# Patient Record
Sex: Female | Born: 1945 | Race: White | Hispanic: No | Marital: Married | Smoking: Never smoker
Health system: Southern US, Community
[De-identification: ages and names within clinical notes are randomized; demographics above are authoritative.]

## PROBLEM LIST (undated history)

## (undated) DIAGNOSIS — K7460 Unspecified cirrhosis of liver: Secondary | ICD-10-CM

## (undated) DIAGNOSIS — K219 Gastro-esophageal reflux disease without esophagitis: Secondary | ICD-10-CM

## (undated) DIAGNOSIS — R011 Cardiac murmur, unspecified: Secondary | ICD-10-CM

## (undated) DIAGNOSIS — F329 Major depressive disorder, single episode, unspecified: Secondary | ICD-10-CM

## (undated) DIAGNOSIS — H269 Unspecified cataract: Secondary | ICD-10-CM

## (undated) DIAGNOSIS — F419 Anxiety disorder, unspecified: Secondary | ICD-10-CM

## (undated) DIAGNOSIS — Z973 Presence of spectacles and contact lenses: Secondary | ICD-10-CM

## (undated) DIAGNOSIS — M5410 Radiculopathy, site unspecified: Secondary | ICD-10-CM

## (undated) DIAGNOSIS — J302 Other seasonal allergic rhinitis: Secondary | ICD-10-CM

## (undated) DIAGNOSIS — M797 Fibromyalgia: Secondary | ICD-10-CM

## (undated) DIAGNOSIS — F32A Depression, unspecified: Secondary | ICD-10-CM

## (undated) DIAGNOSIS — K7581 Nonalcoholic steatohepatitis (NASH): Secondary | ICD-10-CM

## (undated) DIAGNOSIS — E039 Hypothyroidism, unspecified: Secondary | ICD-10-CM

## (undated) DIAGNOSIS — I341 Nonrheumatic mitral (valve) prolapse: Secondary | ICD-10-CM

## (undated) HISTORY — PX: TONSILLECTOMY: SUR1361

## (undated) HISTORY — PX: CARPAL TUNNEL RELEASE: SHX101

## (undated) HISTORY — PX: COLONOSCOPY: SHX174

## (undated) HISTORY — PX: CHOLECYSTECTOMY: SHX55

---

## 2015-07-02 ENCOUNTER — Other Ambulatory Visit: Payer: Self-pay | Admitting: Orthopedic Surgery

## 2015-07-05 NOTE — H&P (Signed)
     PREOPERATIVE H&P  Chief Complaint: Left leg pain   HPI: Emily Hess is a 70 y.o. female who presents with ongoing pain in the left leg  MRI reveals spinal stenosis from L3-L5  Patient has failed multiple forms of conservative care and continues to have pain (see office notes for additional details regarding the patient's full course of treatment)  No past medical history on file. No past surgical history on file. Social History   Social History  . Marital Status: N/A    Spouse Name: N/A  . Number of Children: N/A  . Years of Education: N/A   Social History Main Topics  . Smoking status: Not on file  . Smokeless tobacco: Not on file  . Alcohol Use: Not on file  . Drug Use: Not on file  . Sexual Activity: Not on file   Other Topics Concern  . Not on file   Social History Narrative  . No narrative on file   No family history on file. Allergies not on file Prior to Admission medications   Not on File     All other systems have been reviewed and were otherwise negative with the exception of those mentioned in the HPI and as above.  Physical Exam: There were no vitals filed for this visit.  General: Alert, no acute distress Cardiovascular: No pedal edema Respiratory: No cyanosis, no use of accessory musculature Skin: No lesions in the area of chief complaint Neurologic: Sensation intact distally Psychiatric: Patient is competent for consent with normal mood and affect Lymphatic: No axillary or cervical lymphadenopathy   Assessment/Plan: Left leg pain Plan for Procedure(s): LUMBAR 3-4, LUMBAR 4-5 DECOMPRESSION   Emilee HeroUMONSKI,Faduma Cho LEONARD, MD 07/05/2015 8:18 AM

## 2015-07-06 ENCOUNTER — Encounter (HOSPITAL_COMMUNITY): Payer: Self-pay | Admitting: *Deleted

## 2015-07-06 DIAGNOSIS — M4806 Spinal stenosis, lumbar region: Secondary | ICD-10-CM | POA: Diagnosis present

## 2015-07-06 DIAGNOSIS — M79605 Pain in left leg: Secondary | ICD-10-CM | POA: Diagnosis present

## 2015-07-06 DIAGNOSIS — K7460 Unspecified cirrhosis of liver: Secondary | ICD-10-CM | POA: Diagnosis present

## 2015-07-06 DIAGNOSIS — K7581 Nonalcoholic steatohepatitis (NASH): Secondary | ICD-10-CM | POA: Diagnosis present

## 2015-07-06 DIAGNOSIS — J302 Other seasonal allergic rhinitis: Secondary | ICD-10-CM | POA: Diagnosis present

## 2015-07-06 DIAGNOSIS — E039 Hypothyroidism, unspecified: Secondary | ICD-10-CM | POA: Diagnosis present

## 2015-07-06 DIAGNOSIS — I341 Nonrheumatic mitral (valve) prolapse: Secondary | ICD-10-CM | POA: Diagnosis present

## 2015-07-06 DIAGNOSIS — F329 Major depressive disorder, single episode, unspecified: Secondary | ICD-10-CM | POA: Diagnosis present

## 2015-07-06 DIAGNOSIS — F419 Anxiety disorder, unspecified: Secondary | ICD-10-CM | POA: Diagnosis present

## 2015-07-06 DIAGNOSIS — G8918 Other acute postprocedural pain: Principal | ICD-10-CM | POA: Diagnosis present

## 2015-07-06 DIAGNOSIS — K219 Gastro-esophageal reflux disease without esophagitis: Secondary | ICD-10-CM | POA: Diagnosis present

## 2015-07-06 DIAGNOSIS — Z882 Allergy status to sulfonamides status: Secondary | ICD-10-CM | POA: Diagnosis not present

## 2015-07-06 DIAGNOSIS — M5386 Other specified dorsopathies, lumbar region: Secondary | ICD-10-CM | POA: Diagnosis present

## 2015-07-06 MED ORDER — CEFAZOLIN SODIUM-DEXTROSE 2-3 GM-% IV SOLR
2.0000 g | INTRAVENOUS | Status: AC
  Administered 2015-07-07: 2 g via INTRAVENOUS
  Filled 2015-07-06: qty 50

## 2015-07-07 ENCOUNTER — Ambulatory Visit (HOSPITAL_COMMUNITY): Payer: Medicare Other

## 2015-07-07 ENCOUNTER — Encounter (HOSPITAL_COMMUNITY): Payer: Self-pay | Admitting: Certified Registered Nurse Anesthetist

## 2015-07-07 ENCOUNTER — Ambulatory Visit (HOSPITAL_COMMUNITY): Payer: Medicare Other | Admitting: Certified Registered Nurse Anesthetist

## 2015-07-07 ENCOUNTER — Inpatient Hospital Stay (HOSPITAL_COMMUNITY)
Admission: RE | Admit: 2015-07-07 | Discharge: 2015-07-09 | DRG: 941 | Disposition: A | Discharge status: Home Health | Payer: Medicare Other | Source: Ambulatory Visit | Attending: Orthopedic Surgery | Admitting: Orthopedic Surgery

## 2015-07-07 ENCOUNTER — Encounter (HOSPITAL_COMMUNITY)
Admission: RE | Disposition: A | Discharge status: Home Health | Payer: Self-pay | Source: Ambulatory Visit | Attending: Orthopedic Surgery

## 2015-07-07 DIAGNOSIS — K7581 Nonalcoholic steatohepatitis (NASH): Secondary | ICD-10-CM | POA: Diagnosis present

## 2015-07-07 DIAGNOSIS — Z981 Arthrodesis status: Secondary | ICD-10-CM

## 2015-07-07 DIAGNOSIS — M5386 Other specified dorsopathies, lumbar region: Secondary | ICD-10-CM | POA: Diagnosis present

## 2015-07-07 DIAGNOSIS — F329 Major depressive disorder, single episode, unspecified: Secondary | ICD-10-CM | POA: Diagnosis present

## 2015-07-07 DIAGNOSIS — G8918 Other acute postprocedural pain: Secondary | ICD-10-CM | POA: Diagnosis present

## 2015-07-07 DIAGNOSIS — M4806 Spinal stenosis, lumbar region: Secondary | ICD-10-CM | POA: Diagnosis present

## 2015-07-07 DIAGNOSIS — K219 Gastro-esophageal reflux disease without esophagitis: Secondary | ICD-10-CM | POA: Diagnosis present

## 2015-07-07 DIAGNOSIS — J302 Other seasonal allergic rhinitis: Secondary | ICD-10-CM | POA: Diagnosis present

## 2015-07-07 DIAGNOSIS — Z01818 Encounter for other preprocedural examination: Secondary | ICD-10-CM

## 2015-07-07 DIAGNOSIS — Z419 Encounter for procedure for purposes other than remedying health state, unspecified: Secondary | ICD-10-CM

## 2015-07-07 DIAGNOSIS — E039 Hypothyroidism, unspecified: Secondary | ICD-10-CM | POA: Diagnosis present

## 2015-07-07 DIAGNOSIS — K7460 Unspecified cirrhosis of liver: Secondary | ICD-10-CM | POA: Diagnosis present

## 2015-07-07 DIAGNOSIS — M79605 Pain in left leg: Secondary | ICD-10-CM | POA: Diagnosis present

## 2015-07-07 DIAGNOSIS — Z882 Allergy status to sulfonamides status: Secondary | ICD-10-CM | POA: Diagnosis not present

## 2015-07-07 DIAGNOSIS — I341 Nonrheumatic mitral (valve) prolapse: Secondary | ICD-10-CM | POA: Diagnosis present

## 2015-07-07 DIAGNOSIS — Z9889 Other specified postprocedural states: Secondary | ICD-10-CM

## 2015-07-07 DIAGNOSIS — F419 Anxiety disorder, unspecified: Secondary | ICD-10-CM | POA: Diagnosis present

## 2015-07-07 HISTORY — DX: Hypothyroidism, unspecified: E03.9

## 2015-07-07 HISTORY — DX: Gastro-esophageal reflux disease without esophagitis: K21.9

## 2015-07-07 HISTORY — DX: Nonalcoholic steatohepatitis (NASH): K75.81

## 2015-07-07 HISTORY — DX: Unspecified cataract: H26.9

## 2015-07-07 HISTORY — DX: Other seasonal allergic rhinitis: J30.2

## 2015-07-07 HISTORY — DX: Nonrheumatic mitral (valve) prolapse: I34.1

## 2015-07-07 HISTORY — DX: Anxiety disorder, unspecified: F41.9

## 2015-07-07 HISTORY — DX: Cardiac murmur, unspecified: R01.1

## 2015-07-07 HISTORY — PX: LUMBAR LAMINECTOMY/DECOMPRESSION MICRODISCECTOMY: SHX5026

## 2015-07-07 HISTORY — DX: Fibromyalgia: M79.7

## 2015-07-07 HISTORY — DX: Major depressive disorder, single episode, unspecified: F32.9

## 2015-07-07 HISTORY — DX: Depression, unspecified: F32.A

## 2015-07-07 HISTORY — DX: Unspecified cirrhosis of liver: K74.60

## 2015-07-07 LAB — COMPREHENSIVE METABOLIC PANEL
ALT: 24 U/L (ref 14–54)
ANION GAP: 11 (ref 5–15)
AST: 22 U/L (ref 15–41)
Albumin: 4.4 g/dL (ref 3.5–5.0)
Alkaline Phosphatase: 50 U/L (ref 38–126)
BUN: 15 mg/dL (ref 6–20)
CHLORIDE: 102 mmol/L (ref 101–111)
CO2: 24 mmol/L (ref 22–32)
Calcium: 9.4 mg/dL (ref 8.9–10.3)
Creatinine, Ser: 0.83 mg/dL (ref 0.44–1.00)
Glucose, Bld: 106 mg/dL — ABNORMAL HIGH (ref 65–99)
POTASSIUM: 4.4 mmol/L (ref 3.5–5.1)
Sodium: 137 mmol/L (ref 135–145)
Total Bilirubin: 0.8 mg/dL (ref 0.3–1.2)
Total Protein: 7.1 g/dL (ref 6.5–8.1)

## 2015-07-07 LAB — URINALYSIS, ROUTINE W REFLEX MICROSCOPIC
BILIRUBIN URINE: NEGATIVE
GLUCOSE, UA: NEGATIVE mg/dL
Hgb urine dipstick: NEGATIVE
KETONES UR: NEGATIVE mg/dL
NITRITE: NEGATIVE
PROTEIN: NEGATIVE mg/dL
Specific Gravity, Urine: 1.017 (ref 1.005–1.030)
pH: 6 (ref 5.0–8.0)

## 2015-07-07 LAB — CBC WITH DIFFERENTIAL/PLATELET
BASOS ABS: 0 10*3/uL (ref 0.0–0.1)
Basophils Relative: 1 %
EOS PCT: 3 %
Eosinophils Absolute: 0.2 10*3/uL (ref 0.0–0.7)
HCT: 39.3 % (ref 36.0–46.0)
Hemoglobin: 13.7 g/dL (ref 12.0–15.0)
LYMPHS ABS: 1.8 10*3/uL (ref 0.7–4.0)
LYMPHS PCT: 32 %
MCH: 32.8 pg (ref 26.0–34.0)
MCHC: 34.9 g/dL (ref 30.0–36.0)
MCV: 94 fL (ref 78.0–100.0)
MONO ABS: 0.7 10*3/uL (ref 0.1–1.0)
MONOS PCT: 13 %
Neutro Abs: 2.9 10*3/uL (ref 1.7–7.7)
Neutrophils Relative %: 51 %
PLATELETS: 197 10*3/uL (ref 150–400)
RBC: 4.18 MIL/uL (ref 3.87–5.11)
RDW: 13 % (ref 11.5–15.5)
WBC: 5.6 10*3/uL (ref 4.0–10.5)

## 2015-07-07 LAB — PROTIME-INR
INR: 1.06 (ref 0.00–1.49)
PROTHROMBIN TIME: 14 s (ref 11.6–15.2)

## 2015-07-07 LAB — GLUCOSE, CAPILLARY: GLUCOSE-CAPILLARY: 129 mg/dL — AB (ref 65–99)

## 2015-07-07 LAB — URINE MICROSCOPIC-ADD ON

## 2015-07-07 LAB — APTT: APTT: 30 s (ref 24–37)

## 2015-07-07 SURGERY — LUMBAR LAMINECTOMY/DECOMPRESSION MICRODISCECTOMY
Anesthesia: General

## 2015-07-07 MED ORDER — ONDANSETRON HCL 4 MG/2ML IJ SOLN
INTRAMUSCULAR | Status: AC
  Filled 2015-07-07: qty 2

## 2015-07-07 MED ORDER — HYDROMORPHONE HCL 1 MG/ML IJ SOLN
INTRAMUSCULAR | Status: AC
  Administered 2015-07-07: 0.5 mg via INTRAVENOUS
  Filled 2015-07-07: qty 1

## 2015-07-07 MED ORDER — METHYLENE BLUE 0.5 % INJ SOLN
INTRAVENOUS | Status: DC | PRN
  Administered 2015-07-07: 1 mL via SUBMUCOSAL

## 2015-07-07 MED ORDER — DEXAMETHASONE SODIUM PHOSPHATE 4 MG/ML IJ SOLN
INTRAMUSCULAR | Status: DC | PRN
  Administered 2015-07-07: 8 mg via INTRAVENOUS

## 2015-07-07 MED ORDER — SODIUM CHLORIDE 0.9% FLUSH: 3.0000 mL | INTRAVENOUS | Status: DC | PRN

## 2015-07-07 MED ORDER — DEXAMETHASONE SODIUM PHOSPHATE 4 MG/ML IJ SOLN
INTRAMUSCULAR | Status: AC
  Filled 2015-07-07: qty 2

## 2015-07-07 MED ORDER — THROMBIN 20000 UNITS EX SOLR
CUTANEOUS | Status: AC
  Filled 2015-07-07: qty 20000

## 2015-07-07 MED ORDER — DIAZEPAM 5 MG PO TABS
ORAL_TABLET | ORAL | Status: AC
Start: 2015-07-07 — End: 2015-07-08
  Filled 2015-07-07: qty 1

## 2015-07-07 MED ORDER — LIDOCAINE HCL (CARDIAC) 20 MG/ML IV SOLN
INTRAVENOUS | Status: AC
  Filled 2015-07-07: qty 5

## 2015-07-07 MED ORDER — MIDAZOLAM HCL 2 MG/2ML IJ SOLN
INTRAMUSCULAR | Status: DC | PRN
  Administered 2015-07-07: 2 mg via INTRAVENOUS

## 2015-07-07 MED ORDER — GLYCOPYRROLATE 0.2 MG/ML IJ SOLN
INTRAMUSCULAR | Status: AC
  Filled 2015-07-07: qty 1

## 2015-07-07 MED ORDER — OXYCODONE-ACETAMINOPHEN 5-325 MG PO TABS
1.0000 | ORAL_TABLET | Freq: Once | ORAL | Status: AC
  Administered 2015-07-07: 2 via ORAL

## 2015-07-07 MED ORDER — BUPIVACAINE-EPINEPHRINE (PF) 0.25% -1:200000 IJ SOLN
INTRAMUSCULAR | Status: AC
  Filled 2015-07-07: qty 30

## 2015-07-07 MED ORDER — SUGAMMADEX SODIUM 200 MG/2ML IV SOLN
INTRAVENOUS | Status: AC
  Filled 2015-07-07: qty 2

## 2015-07-07 MED ORDER — DIAZEPAM 5 MG PO TABS
5.0000 mg | ORAL_TABLET | Freq: Four times a day (QID) | ORAL | Status: DC | PRN
  Administered 2015-07-07: 5 mg via ORAL
  Administered 2015-07-08 (×4): 10 mg via ORAL
  Administered 2015-07-09: 5 mg via ORAL
  Administered 2015-07-09: 10 mg via ORAL
  Filled 2015-07-07 (×4): qty 2
  Filled 2015-07-07: qty 1
  Filled 2015-07-07: qty 2
  Filled 2015-07-07 (×2): qty 1

## 2015-07-07 MED ORDER — DIAZEPAM 5 MG PO TABS
5.0000 mg | ORAL_TABLET | Freq: Once | ORAL | Status: AC
  Administered 2015-07-07: 5 mg via ORAL

## 2015-07-07 MED ORDER — PROMETHAZINE HCL 25 MG/ML IJ SOLN: 6.2500 mg | Freq: Four times a day (QID) | INTRAMUSCULAR | Status: DC | PRN

## 2015-07-07 MED ORDER — PHENYLEPHRINE HCL 10 MG/ML IJ SOLN
10.0000 mg | INTRAVENOUS | Status: DC | PRN
  Administered 2015-07-07: 50 ug/min via INTRAVENOUS

## 2015-07-07 MED ORDER — ARTIFICIAL TEARS OP OINT
TOPICAL_OINTMENT | OPHTHALMIC | Status: DC | PRN
  Administered 2015-07-07: 1 via OPHTHALMIC

## 2015-07-07 MED ORDER — FENTANYL CITRATE (PF) 250 MCG/5ML IJ SOLN
INTRAMUSCULAR | Status: DC | PRN
  Administered 2015-07-07: 100 ug via INTRAVENOUS
  Administered 2015-07-07: 50 ug via INTRAVENOUS
  Administered 2015-07-07: 100 ug via INTRAVENOUS

## 2015-07-07 MED ORDER — LORAZEPAM 2 MG/ML IJ SOLN
INTRAMUSCULAR | Status: AC
  Administered 2015-07-07: 0.5 mg via INTRAVENOUS
  Filled 2015-07-07: qty 1

## 2015-07-07 MED ORDER — LORAZEPAM 2 MG/ML IJ SOLN
0.5000 mg | Freq: Once | INTRAMUSCULAR | Status: AC
  Administered 2015-07-07: 0.5 mg via INTRAVENOUS

## 2015-07-07 MED ORDER — PROPOFOL 10 MG/ML IV BOLUS
INTRAVENOUS | Status: DC | PRN
  Administered 2015-07-07: 150 mg via INTRAVENOUS

## 2015-07-07 MED ORDER — MIDAZOLAM HCL 2 MG/2ML IJ SOLN
INTRAMUSCULAR | Status: AC
  Filled 2015-07-07: qty 2

## 2015-07-07 MED ORDER — FENTANYL CITRATE (PF) 250 MCG/5ML IJ SOLN
INTRAMUSCULAR | Status: AC
  Filled 2015-07-07: qty 5

## 2015-07-07 MED ORDER — LACTATED RINGERS IV SOLN
INTRAVENOUS | Status: DC
  Administered 2015-07-07 (×3): via INTRAVENOUS

## 2015-07-07 MED ORDER — LIDOCAINE HCL (CARDIAC) 20 MG/ML IV SOLN
INTRAVENOUS | Status: DC | PRN
  Administered 2015-07-07: 80 mg via INTRATRACHEAL

## 2015-07-07 MED ORDER — BUPIVACAINE-EPINEPHRINE 0.25% -1:200000 IJ SOLN
INTRAMUSCULAR | Status: DC | PRN
  Administered 2015-07-07: 30 mL

## 2015-07-07 MED ORDER — NEOSTIGMINE METHYLSULFATE 10 MG/10ML IV SOLN
INTRAVENOUS | Status: AC
  Filled 2015-07-07: qty 1

## 2015-07-07 MED ORDER — GLYCOPYRROLATE 0.2 MG/ML IJ SOLN
INTRAMUSCULAR | Status: AC
  Filled 2015-07-07: qty 3

## 2015-07-07 MED ORDER — METHYLPREDNISOLONE ACETATE 40 MG/ML IJ SUSP
INTRAMUSCULAR | Status: AC
  Filled 2015-07-07: qty 1

## 2015-07-07 MED ORDER — PROPOFOL 10 MG/ML IV BOLUS
INTRAVENOUS | Status: AC
  Filled 2015-07-07: qty 20

## 2015-07-07 MED ORDER — POVIDONE-IODINE 7.5 % EX SOLN
Freq: Once | CUTANEOUS | Status: DC
  Filled 2015-07-07: qty 118

## 2015-07-07 MED ORDER — GLYCOPYRROLATE 0.2 MG/ML IJ SOLN
INTRAMUSCULAR | Status: DC | PRN
  Administered 2015-07-07: 0.6 mg via INTRAVENOUS
  Administered 2015-07-07: 0.2 mg via INTRAVENOUS

## 2015-07-07 MED ORDER — THROMBIN 20000 UNITS EX SOLR
CUTANEOUS | Status: DC | PRN
  Administered 2015-07-07: 14:00:00 via TOPICAL

## 2015-07-07 MED ORDER — OXYCODONE-ACETAMINOPHEN 5-325 MG PO TABS
ORAL_TABLET | ORAL | Status: AC
Start: 2015-07-07 — End: 2015-07-08
  Filled 2015-07-07: qty 2

## 2015-07-07 MED ORDER — PROMETHAZINE HCL 12.5 MG PO TABS: 12.5000 mg | ORAL_TABLET | Freq: Four times a day (QID) | ORAL | Status: DC | PRN

## 2015-07-07 MED ORDER — SODIUM CHLORIDE 0.9 % IV SOLN
250.0000 mL | INTRAVENOUS | Status: DC
  Administered 2015-07-07: 250 mL via INTRAVENOUS

## 2015-07-07 MED ORDER — METHYLENE BLUE 0.5 % INJ SOLN
INTRAVENOUS | Status: AC
  Filled 2015-07-07: qty 10

## 2015-07-07 MED ORDER — HYDROMORPHONE HCL 1 MG/ML IJ SOLN
0.2500 mg | INTRAMUSCULAR | Status: DC | PRN
  Administered 2015-07-07 (×4): 0.5 mg via INTRAVENOUS

## 2015-07-07 MED ORDER — NEOSTIGMINE METHYLSULFATE 10 MG/10ML IV SOLN
INTRAVENOUS | Status: DC | PRN
  Administered 2015-07-07: 4 mg via INTRAVENOUS

## 2015-07-07 MED ORDER — ONDANSETRON HCL 4 MG/2ML IJ SOLN
INTRAMUSCULAR | Status: DC | PRN
  Administered 2015-07-07 (×2): 4 mg via INTRAVENOUS

## 2015-07-07 MED ORDER — OXYCODONE-ACETAMINOPHEN 5-325 MG PO TABS: 1.0000 | ORAL_TABLET | ORAL | Status: DC | PRN

## 2015-07-07 MED ORDER — ROCURONIUM BROMIDE 50 MG/5ML IV SOLN
INTRAVENOUS | Status: AC
  Filled 2015-07-07: qty 1

## 2015-07-07 MED ORDER — METHYLPREDNISOLONE ACETATE 40 MG/ML IJ SUSP
INTRAMUSCULAR | Status: DC | PRN
Start: 2015-07-07 — End: 2015-07-07
  Administered 2015-07-07: 40 mg

## 2015-07-07 MED ORDER — OXYCODONE-ACETAMINOPHEN 5-325 MG PO TABS
1.0000 | ORAL_TABLET | ORAL | Status: DC | PRN
  Administered 2015-07-07 – 2015-07-09 (×10): 2 via ORAL
  Filled 2015-07-07 (×11): qty 2

## 2015-07-07 MED ORDER — 0.9 % SODIUM CHLORIDE (POUR BTL) OPTIME
TOPICAL | Status: DC | PRN
  Administered 2015-07-07 (×2): 1000 mL

## 2015-07-07 MED ORDER — SODIUM CHLORIDE 0.9% FLUSH
3.0000 mL | Freq: Two times a day (BID) | INTRAVENOUS | Status: DC
  Administered 2015-07-08 – 2015-07-09 (×2): 3 mL via INTRAVENOUS

## 2015-07-07 MED ORDER — ROCURONIUM BROMIDE 100 MG/10ML IV SOLN
INTRAVENOUS | Status: DC | PRN
  Administered 2015-07-07: 50 mg via INTRAVENOUS

## 2015-07-07 MED ORDER — HYDROMORPHONE HCL 1 MG/ML IJ SOLN
0.5000 mg | INTRAMUSCULAR | Status: DC | PRN
  Administered 2015-07-07 (×2): 0.5 mg via INTRAVENOUS

## 2015-07-07 MED ORDER — PHENYLEPHRINE HCL 10 MG/ML IJ SOLN
INTRAMUSCULAR | Status: DC | PRN
  Administered 2015-07-07: 120 ug via INTRAVENOUS
  Administered 2015-07-07: 160 ug via INTRAVENOUS
  Administered 2015-07-07: 120 ug via INTRAVENOUS

## 2015-07-07 SURGICAL SUPPLY — 71 items
BENZOIN TINCTURE PRP APPL 2/3 (GAUZE/BANDAGES/DRESSINGS) ×3 IMPLANT
BUR ROUND PRECISION 4.0 (BURR) ×2 IMPLANT
BUR ROUND PRECISION 4.0MM (BURR) ×1
CANISTER SUCTION 2500CC (MISCELLANEOUS) ×3 IMPLANT
CARTRIDGE OIL MAESTRO DRILL (MISCELLANEOUS) ×1 IMPLANT
CLOSURE WOUND 1/2 X4 (GAUZE/BANDAGES/DRESSINGS) ×1
CORDS BIPOLAR (ELECTRODE) ×3 IMPLANT
COVER SURGICAL LIGHT HANDLE (MISCELLANEOUS) ×3 IMPLANT
DIFFUSER DRILL AIR PNEUMATIC (MISCELLANEOUS) ×3 IMPLANT
DRAIN CHANNEL 15F RND FF W/TCR (WOUND CARE) IMPLANT
DRAPE POUCH INSTRU U-SHP 10X18 (DRAPES) ×3 IMPLANT
DRAPE SURG 17X23 STRL (DRAPES) ×12 IMPLANT
DURAPREP 26ML APPLICATOR (WOUND CARE) ×3 IMPLANT
ELECT BLADE 4.0 EZ CLEAN MEGAD (MISCELLANEOUS) ×3
ELECT CAUTERY BLADE 6.4 (BLADE) ×3 IMPLANT
ELECT REM PT RETURN 9FT ADLT (ELECTROSURGICAL) ×3
ELECTRODE BLDE 4.0 EZ CLN MEGD (MISCELLANEOUS) ×1 IMPLANT
ELECTRODE REM PT RTRN 9FT ADLT (ELECTROSURGICAL) ×1 IMPLANT
EVACUATOR SILICONE 100CC (DRAIN) IMPLANT
FILTER STRAW FLUID ASPIR (MISCELLANEOUS) ×3 IMPLANT
GAUZE SPONGE 4X4 12PLY STRL (GAUZE/BANDAGES/DRESSINGS) ×3 IMPLANT
GAUZE SPONGE 4X4 16PLY XRAY LF (GAUZE/BANDAGES/DRESSINGS) IMPLANT
GLOVE BIO SURGEON STRL SZ7 (GLOVE) ×3 IMPLANT
GLOVE BIO SURGEON STRL SZ8 (GLOVE) ×3 IMPLANT
GLOVE BIOGEL PI IND STRL 7.0 (GLOVE) ×1 IMPLANT
GLOVE BIOGEL PI IND STRL 8 (GLOVE) ×1 IMPLANT
GLOVE BIOGEL PI INDICATOR 7.0 (GLOVE) ×2
GLOVE BIOGEL PI INDICATOR 8 (GLOVE) ×2
GOWN STRL REUS W/ TWL LRG LVL3 (GOWN DISPOSABLE) ×1 IMPLANT
GOWN STRL REUS W/ TWL XL LVL3 (GOWN DISPOSABLE) ×2 IMPLANT
GOWN STRL REUS W/TWL LRG LVL3 (GOWN DISPOSABLE) ×2
GOWN STRL REUS W/TWL XL LVL3 (GOWN DISPOSABLE) ×4
IV CATH 14GX2 1/4 (CATHETERS) ×3 IMPLANT
KIT BASIN OR (CUSTOM PROCEDURE TRAY) ×3 IMPLANT
KIT POSITION SURG JACKSON T1 (MISCELLANEOUS) ×3 IMPLANT
KIT ROOM TURNOVER OR (KITS) ×3 IMPLANT
NEEDLE 18GX1X1/2 (RX/OR ONLY) (NEEDLE) ×3 IMPLANT
NEEDLE 22X1 1/2 (OR ONLY) (NEEDLE) ×3 IMPLANT
NEEDLE HYPO 25GX1X1/2 BEV (NEEDLE) ×3 IMPLANT
NEEDLE SPNL 18GX3.5 QUINCKE PK (NEEDLE) ×6 IMPLANT
NS IRRIG 1000ML POUR BTL (IV SOLUTION) ×3 IMPLANT
OIL CARTRIDGE MAESTRO DRILL (MISCELLANEOUS) ×3
PACK LAMINECTOMY ORTHO (CUSTOM PROCEDURE TRAY) ×3 IMPLANT
PACK UNIVERSAL I (CUSTOM PROCEDURE TRAY) ×3 IMPLANT
PAD ARMBOARD 7.5X6 YLW CONV (MISCELLANEOUS) ×6 IMPLANT
PATTIES SURGICAL .5 X.5 (GAUZE/BANDAGES/DRESSINGS) IMPLANT
PATTIES SURGICAL .5 X1 (DISPOSABLE) ×3 IMPLANT
SPONGE INTESTINAL PEANUT (DISPOSABLE) ×3 IMPLANT
SPONGE LAP 4X18 X RAY DECT (DISPOSABLE) ×9 IMPLANT
SPONGE SURGIFOAM ABS GEL 100 (HEMOSTASIS) ×3 IMPLANT
SPONGE SURGIFOAM ABS GEL SZ50 (HEMOSTASIS) ×3 IMPLANT
STRIP CLOSURE SKIN 1/2X4 (GAUZE/BANDAGES/DRESSINGS) ×2 IMPLANT
SURGIFLO W/THROMBIN 8M KIT (HEMOSTASIS) ×3 IMPLANT
SUT BONE WAX W31G (SUTURE) ×9 IMPLANT
SUT MNCRL AB 4-0 PS2 18 (SUTURE) ×3 IMPLANT
SUT VIC AB 0 CT1 18XCR BRD 8 (SUTURE) IMPLANT
SUT VIC AB 0 CT1 27 (SUTURE)
SUT VIC AB 0 CT1 27XBRD ANBCTR (SUTURE) IMPLANT
SUT VIC AB 0 CT1 8-18 (SUTURE)
SUT VIC AB 1 CT1 18XCR BRD 8 (SUTURE) ×2 IMPLANT
SUT VIC AB 1 CT1 8-18 (SUTURE) ×4
SUT VIC AB 2-0 CT2 18 VCP726D (SUTURE) ×6 IMPLANT
SYR 20CC LL (SYRINGE) ×3 IMPLANT
SYR BULB IRRIGATION 50ML (SYRINGE) ×3 IMPLANT
SYR CONTROL 10ML LL (SYRINGE) ×6 IMPLANT
SYR TB 1ML 26GX3/8 SAFETY (SYRINGE) ×6 IMPLANT
SYR TB 1ML LUER SLIP (SYRINGE) ×6 IMPLANT
TOWEL OR 17X24 6PK STRL BLUE (TOWEL DISPOSABLE) ×3 IMPLANT
TOWEL OR 17X26 10 PK STRL BLUE (TOWEL DISPOSABLE) ×3 IMPLANT
WATER STERILE IRR 1000ML POUR (IV SOLUTION) ×3 IMPLANT
YANKAUER SUCT BULB TIP NO VENT (SUCTIONS) ×3 IMPLANT

## 2015-07-07 NOTE — Progress Notes (Signed)
Pt co's of severe pain after IV dilaudid & PO  Percocet. Anesthesia notified. Additional dilaudid ordered.

## 2015-07-07 NOTE — Anesthesia Preprocedure Evaluation (Signed)
Anesthesia Evaluation  Patient identified by MRN, date of birth, ID band Patient awake    Reviewed: Allergy & Precautions, H&P , NPO status , Patient's Chart, lab work & pertinent test results  Airway Mallampati: II  TM Distance: >3 FB Neck ROM: Full    Dental no notable dental hx. (+) Teeth Intact, Dental Advisory Given   Pulmonary neg pulmonary ROS,    Pulmonary exam normal breath sounds clear to auscultation       Cardiovascular negative cardio ROS  + Valvular Problems/Murmurs MVP  Rhythm:Regular Rate:Normal     Neuro/Psych Anxiety Depression negative neurological ROS     GI/Hepatic Neg liver ROS, GERD  Medicated and Controlled,  Endo/Other  Hypothyroidism   Renal/GU negative Renal ROS  negative genitourinary   Musculoskeletal   Abdominal   Peds  Hematology negative hematology ROS (+)   Anesthesia Other Findings   Reproductive/Obstetrics negative OB ROS                             Anesthesia Physical Anesthesia Plan  ASA: II  Anesthesia Plan: General   Post-op Pain Management:    Induction: Intravenous  Airway Management Planned: Oral ETT  Additional Equipment:   Intra-op Plan:   Post-operative Plan: Extubation in OR  Informed Consent: I have reviewed the patients History and Physical, chart, labs and discussed the procedure including the risks, benefits and alternatives for the proposed anesthesia with the patient or authorized representative who has indicated his/her understanding and acceptance.   Dental advisory given  Plan Discussed with: CRNA  Anesthesia Plan Comments:         Anesthesia Quick Evaluation

## 2015-07-07 NOTE — Progress Notes (Signed)
Pt is able to rest at times. Still very anxious & co's of "terrible" pain. Requesting again to be admitted. P.A. Notified again.  Orders to admit.

## 2015-07-07 NOTE — Transfer of Care (Signed)
Immediate Anesthesia Transfer of Care Note  Patient: Emily Hess  Procedure(s) Performed: Procedure(s): LUMBAR 3-4, LUMBAR 4-5 DECOMPRESSION (N/A)  Patient Location: PACU  Anesthesia Type:General  Level of Consciousness: awake and patient cooperative  Airway & Oxygen Therapy: Patient Spontanous Breathing and Patient connected to nasal cannula oxygen  Post-op Assessment: Report given to RN and Patient moving all extremities  Post vital signs: Reviewed and stable  Last Vitals:  Filed Vitals:   07/07/15 1040 07/07/15 1648  BP: 134/89 122/74  Pulse: 81   Temp: 36.7 C   Resp: 20 18    Complications: No apparent anesthesia complications

## 2015-07-07 NOTE — Anesthesia Procedure Notes (Signed)
Procedure Name: Intubation Date/Time: 07/07/2015 1:03 PM Performed by: Salomon MastWALL, Nolene Rocks COREY Pre-anesthesia Checklist: Patient identified, Emergency Drugs available, Suction available and Patient being monitored Patient Re-evaluated:Patient Re-evaluated prior to inductionOxygen Delivery Method: Circle system utilized Preoxygenation: Pre-oxygenation with 100% oxygen Intubation Type: IV induction Ventilation: Mask ventilation without difficulty Laryngoscope Size: Mac and 3 Grade View: Grade I Tube type: Oral Tube size: 7.0 mm Number of attempts: 1 Airway Equipment and Method: Stylet Placement Confirmation: ETT inserted through vocal cords under direct vision,  positive ETCO2 and breath sounds checked- equal and bilateral Secured at: 21 cm Tube secured with: Tape (Tegaderm over pink tape ) Dental Injury: Teeth and Oropharynx as per pre-operative assessment

## 2015-07-07 NOTE — Op Note (Signed)
NAMElmyra Ricks:  Hess, Emily             ACCOUNT NO.:  [id number]  MEDICAL RECORD NO.:  [id number]  LOCATION:  [id number]                        FACILITY:  [facility]  PHYSICIAN:  Estill BambergMark Yordi Krager, MD      DATE OF BIRTH:  [birth year]  DATE OF PROCEDURE:  07/07/2015                              OPERATIVE REPORT   PREOPERATIVE DIAGNOSIS:  Spinal stenosis L3-4, L4-5.  POSTOPERATIVE DIAGNOSIS:  Spinal stenosis L3-4, L4-5.  PROCEDURE:  L3-4, L4-5 laminectomy with bilateral partial facetectomy and foraminotomy.  SURGEON:  Estill BambergMark Aydan Phoenix, MD  ASSISTANT:  Jason CoopKayla McKenzie, PA-C  ANESTHESIA:  General endotracheal anesthesia.  COMPLICATIONS:  None.  DISPOSITION:  Stable.  ESTIMATED BLOOD LOSS:  100 mL.  INDICATIONS FOR SURGERY:  Briefly, Ms. Emily Hess is a very pleasant 70 year old female, who did present to me with ongoing and severe pain in the left leg.  She did feel that her pain was severe and debilitating and ongoing.  She did fail appropriate forms of conservative care.  An MRI did reveal spinal stenosis at both the L3-4 and L4-5 levels with a facet cyst noted at the L3-4 level.  Given the patient's ongoing pain, despite appropriate conservative care, we did discuss proceeding with the procedure noted above.  The patient was fully aware of the risks and limitations of surgery, and did elect to proceed.  OPERATIVE DETAILS:  On July 07, 2015, patient was brought to surgery and general endotracheal anesthesia was administered.  The patient was placed prone on a well-padded flat Jackson bed with a spinal frame. Antibiotics were given and a time-out procedure was performed.  The back was prepped and draped.  A midline incision was then made spanning the L3-L5 levels.  The fascia was incised in the midline.  The paraspinal musculature was bluntly retracted using a self-retaining retractor.  The lamina of L3, L4, and L5 were identified and subperiosteally exposed.  A lateral intraoperative radiograph  did confirm the appropriate operative level.  I then removed the spinous processes of L3 and L4 and the superior aspect of the L5 spinous process.  I then performed a central decompression, followed by a bilateral partial facetectomy, thereby decompressing the lateral recesses on the right and left sides at both the L3-4 and L4-5 levels.  A facet cyst was noted on the left side at the L3-4 level.  This was removed in its entirety.  At the termination of the procedure, I was easily able to pass a [facility] elevator out the foramina on the left and right sides of both the L3-4 and L4-5 levels. I was very pleased with the decompression.  All bleeding was controlled at the termination of the procedure.  Of note, the wound was copiously irrigated throughout the surgery with a total of approximately 2 L of normal saline.  I then introduced 40 mg of Depo-Medrol about the epidural space.  There was no extravasation of cerebrospinal fluid noted throughout the entire surgery.  The wound was then closed in layers using #1 Vicryl followed by 2-0 Vicryl, followed by 3-0 Monocryl. Benzoin and Steri-Strips were applied followed by sterile dressing.  All instrument counts were correct at the termination of the procedure.  Of note, Kayla  McKenzie was my assistant throughout surgery, and did aid in retraction, suctioning, and closure from start to finish.     Estill Bamberg, MD     MD/MEDQ  D:  07/07/2015  T:  07/07/2015  Job:  [id number]  cc:   Dr. Stann Mainland

## 2015-07-07 NOTE — Progress Notes (Signed)
Pt still co's of severe pain; very anxious; does not want to go home. P.A. Notified. Orders for IV ativan & PO valium.

## 2015-07-08 ENCOUNTER — Encounter (HOSPITAL_COMMUNITY): Payer: Self-pay | Admitting: Orthopedic Surgery

## 2015-07-08 DIAGNOSIS — Z981 Arthrodesis status: Secondary | ICD-10-CM

## 2015-07-08 DIAGNOSIS — M79605 Pain in left leg: Secondary | ICD-10-CM | POA: Diagnosis not present

## 2015-07-08 DIAGNOSIS — G8918 Other acute postprocedural pain: Secondary | ICD-10-CM | POA: Diagnosis not present

## 2015-07-08 MED ORDER — OXYCODONE HCL ER 20 MG PO T12A: 20.0000 mg | EXTENDED_RELEASE_TABLET | Freq: Two times a day (BID) | ORAL | Status: DC

## 2015-07-08 MED ORDER — OXYCODONE HCL ER 20 MG PO T12A
20.0000 mg | EXTENDED_RELEASE_TABLET | Freq: Two times a day (BID) | ORAL | Status: DC
  Administered 2015-07-08 – 2015-07-09 (×3): 20 mg via ORAL
  Filled 2015-07-08 (×3): qty 1

## 2015-07-08 NOTE — Evaluation (Signed)
Physical Therapy Evaluation Patient Details Name: Emily Hess MRN: [id number] DOB: 1945-09-06 Today's Date: 07/08/2015   History of Present Illness  Patient is s 70 y/o female admitted with Spinal stenosis L3-4, L4-5, now s/p L3-4, L4-5 laminectomy with bilateral partial facetectomy and foraminotomy.  Clinical Impression  Patient presents with decreased mobility due to deficits listed in PT problem list.  She will benefit from skilled PT in the acute setting to allow return home with family support following one more inpatient day due to pain, weakness and unable to negotiate stairs yet for home entry.  Will follow up later today as time allows, but encouraged pt to walk in hallway with nursing as well.     Follow Up Recommendations Home health PT    Equipment Recommendations  Rolling walker with 5" wheels    Recommendations for Other Services       Precautions / Restrictions Precautions Precautions: Fall;Back      Mobility  Bed Mobility Overal bed mobility: Needs Assistance Bed Mobility: Rolling;Sidelying to Sit Rolling: Supervision Sidelying to sit: Min assist;HOB elevated       General bed mobility comments: increased time, assist to lift trunk; demonstrated sit<>side and use of edge of bed to push up, but pt anxious about being able to perform on her own due to weakness and pain  Transfers Overall transfer level: Needs assistance Equipment used: Rolling walker (2 wheeled) Transfers: Sit to/from Stand Sit to Stand: Min guard         General transfer comment: cues for backing up to toilet and chair and for not twisting  Ambulation/Gait Ambulation/Gait assistance: Min guard Ambulation Distance (Feet): 70 Feet Assistive device: Rolling walker (2 wheeled) Gait Pattern/deviations: Step-through pattern;Decreased stride length;Antalgic     General Gait Details: slow and tearful throughout due to back pain and c/o weakness noted increased pallor BP 121/60 after  seated in chair while I collected dinamap  Stairs            Wheelchair Mobility    Modified Rankin (Stroke Patients Only)       Balance Overall balance assessment: Needs assistance   Sitting balance-Leahy Scale: Good     Standing balance support: Single extremity supported;Bilateral upper extremity supported Standing balance-Leahy Scale: Poor Standing balance comment: UE support needed more for pain than balance                             Pertinent Vitals/Pain Pain Assessment: 0-10 Pain Score: 10-Worst pain ever Pain Location: back at surgical area Pain Descriptors / Indicators: Operative site guarding;Grimacing Pain Intervention(s): Monitored during session;Patient requesting pain meds-RN notified;Ice applied    Home Living Family/patient expects to be discharged to:: Private residence Living Arrangements: Spouse/significant other Available Help at Discharge: Family Type of Home: House Home Access: Stairs to enter Entrance Stairs-Rails: None Entrance Stairs-Number of Steps: 1-2 Home Layout: Full bath on main level;Two level;Bed/bath upstairs;Able to live on main level with bedroom/bathroom Home Equipment: None      Prior Function Level of Independence: Independent               Hand Dominance        Extremity/Trunk Assessment               Lower Extremity Assessment: Generalized weakness         Communication   Communication: No difficulties  Cognition Arousal/Alertness: Awake/alert Behavior During Therapy: Anxious Overall Cognitive Status: Within Functional Limits for  tasks assessed                      General Comments General comments (skin integrity, edema, etc.): spouse in room and encouraging pt to go home today.  Patient very anxious and doesn't feel she can safely manage to get up and move about due to weakness and pain.    Exercises        Assessment/Plan    PT Assessment Patient needs continued  PT services  PT Diagnosis Difficulty walking;Acute pain;Generalized weakness   PT Problem List Decreased strength;Decreased activity tolerance;Decreased balance;Decreased knowledge of precautions;Decreased mobility;Decreased knowledge of use of DME;Pain  PT Treatment Interventions DME instruction;Gait training;Stair training;Balance training;Functional mobility training;Therapeutic activities;Therapeutic exercise;Patient/family education   PT Goals (Current goals can be found in the Care Plan section) Acute Rehab PT Goals Patient Stated Goal: To get stronger prior to going home PT Goal Formulation: With patient/family Time For Goal Achievement: 07/10/15 Potential to Achieve Goals: Good    Frequency Min 5X/week   Barriers to discharge        Co-evaluation               End of Session Equipment Utilized During Treatment: Gait belt Activity Tolerance: Patient limited by pain Patient left: in chair;with call bell/phone within reach;with family/visitor present           Time: 0905-0930 PT Time Calculation (min) (ACUTE ONLY): 25 min   Charges:   PT Evaluation $PT Eval Moderate Complexity: 1 Procedure PT Treatments $Gait Training: 8-22 mins   PT G CodesElray Mcgregor 07-17-15, 9:42 AM  Sheran Lawless, PT [phone] 07/17/2015

## 2015-07-08 NOTE — Clinical Social Work Note (Signed)
CSW received referral for SNF.  Case discussed with case manager, and plan is to discharge home with home health.  CSW to sign off please re-consult if social work needs arise.  Eladia Frame R. Albino Bufford, MSW, LCSWA [phone]  

## 2015-07-08 NOTE — Progress Notes (Signed)
I appreciate RN Silva's documentation of Emily Hess's ambulation x 2 and minimal pain. Glad to see that she is doing better. Will tentatively plan for d/c in the AM as long as she continues to do well.

## 2015-07-08 NOTE — Progress Notes (Signed)
Pt ambulated down the hall x2 with the RN.  Gait steady, pain minimal.  Will continue to monitor. Sondra ComeSilva, Esten Dollar M, RN

## 2015-07-08 NOTE — Care Management Note (Signed)
Case Management Note  Patient Details  Name: Emily Hess MRN: [id number] Date of Birth: [birth year]  Subjective/Objective:                    Action/Plan: Patient discharging home with self care. No further needs per CM.   Expected Discharge Date:                  Expected Discharge Plan:  Home/Self Care  In-House Referral:     Discharge planning Services     Post Acute Care Choice:    Choice offered to:     DME Arranged:    DME Agency:     HH Arranged:    HH Agency:     Status of Service:  Completed, signed off  Medicare Important Message Given:    Date Medicare IM Given:    Medicare IM give by:    Date Additional Medicare IM Given:    Additional Medicare Important Message give by:     If discussed at Long Length of Stay Meetings, dates discussed:    Additional Comments:  Kermit BaloKelli F Taleia Sadowski, RN 07/08/2015, 9:15 AM

## 2015-07-08 NOTE — Progress Notes (Signed)
    Patient admitted overnight for atypical postop back pain Patient reports resolution of L leg pain Pain pain better vs last night    Physical Exam: Filed Vitals:   07/08/15 0100 07/08/15 0615  BP: 109/59 113/55  Pulse: 95 87  Temp: 97.7 F (36.5 C) 98.2 F (36.8 C)  Resp: 18 18    Dressing in place NVI  POD #1 s/p L3-5 decompression, admitted for pain control  - encourage ambulation - Percocet for pain, Valium for muscle spasms - likely d/c home today after PT session this AM

## 2015-07-09 DIAGNOSIS — G8918 Other acute postprocedural pain: Secondary | ICD-10-CM | POA: Diagnosis not present

## 2015-07-09 DIAGNOSIS — M79605 Pain in left leg: Secondary | ICD-10-CM | POA: Diagnosis not present

## 2015-07-09 NOTE — Progress Notes (Signed)
Physical Therapy Treatment Patient Details Name: Emily Hess MRN: [id number] DOB: [birth year] Today's Date: 07/09/2015    History of Present Illness Patient is s 70 y/o female admitted with Spinal stenosis L3-4, L4-5, now s/p L3-4, L4-5 laminectomy with bilateral partial facetectomy and foraminotomy.    PT Comments    Pt moving well with increased activity tolerance and adherence to precautions. Handout provided with education for transfers, gait, function, stairs, car transfer, brace wear and mobility provided to pt, spouse and dgtr. Pt needs cues to maintain precautions but otherwise is moving well. Pt has met goals for safe D/C and will continue to follow acutely.  Follow Up Recommendations  Home health PT     Equipment Recommendations  Rolling walker with 5" wheels    Recommendations for Other Services       Precautions / Restrictions Precautions Precautions: Fall;Back Precaution Booklet Issued: Yes (comment) Required Braces or Orthoses: Spinal Brace Spinal Brace: Lumbar corset;Applied in sitting position    Mobility  Bed Mobility Overal bed mobility: Needs Assistance Bed Mobility: Rolling;Sidelying to Sit Rolling: Supervision Sidelying to sit: Supervision       General bed mobility comments: cues for sequence and to prevent twisiting, no rails,bed flat  Transfers Overall transfer level: Needs assistance   Transfers: Sit to/from Stand Sit to Stand: Min guard         General transfer comment: cues for hand placement  Ambulation/Gait Ambulation/Gait assistance: Min guard Ambulation Distance (Feet): 250 Feet Assistive device: Rolling walker (2 wheeled) Gait Pattern/deviations: Step-through pattern;Decreased stride length   Gait velocity interpretation: Below normal speed for age/gender General Gait Details: cues for direction and posture   Stairs Stairs: Yes Stairs assistance: Min guard Stair Management: Step to pattern;Forwards;One rail  Left Number of Stairs: 11 General stair comments: Pt ambulated 2 stairs with HHA and full flight with rail, cues for sequence and posture  Wheelchair Mobility    Modified Rankin (Stroke Patients Only)       Balance                                    Cognition Arousal/Alertness: Awake/alert Behavior During Therapy: WFL for tasks assessed/performed Overall Cognitive Status: Within Functional Limits for tasks assessed       Memory: Decreased recall of precautions              Exercises      General Comments        Pertinent Vitals/Pain Pain Score: 6  Pain Location: back incision Pain Descriptors / Indicators: Aching Pain Intervention(s): Limited activity within patient's tolerance;Monitored during session;Premedicated before session;Repositioned    Home Living                      Prior Function            PT Goals (current goals can now be found in the care plan section) Progress towards PT goals: Progressing toward goals    Frequency       PT Plan Current plan remains appropriate    Co-evaluation             End of Session Equipment Utilized During Treatment: Back brace Activity Tolerance: Patient tolerated treatment well Patient left: in chair;with call bell/phone within reach;with family/visitor present     Time: 3557-3220 PT Time Calculation (min) (ACUTE ONLY): 30 min  Charges:  $Gait Training: 8-22 mins $Therapeutic Activity: 8-22  mins                    G Codes:      Melford Aase July 24, 2015, 10:55 AM Elwyn Reach, [place]

## 2015-07-09 NOTE — Progress Notes (Signed)
Called MD's office to get an order for home PT for patient.

## 2015-07-09 NOTE — Progress Notes (Signed)
Patient is being d/c home. D/c instructions given and patient and family verbalized understanding. 

## 2015-07-09 NOTE — Progress Notes (Signed)
Called office back to let them know that patient RN cannot put in the orders because of the patient's insurance.

## 2015-07-09 NOTE — Anesthesia Postprocedure Evaluation (Signed)
Anesthesia Post Note  Patient: Raphael Gibneyatricia Selner  Procedure(s) Performed: Procedure(s) (LRB): LUMBAR 3-4, LUMBAR 4-5 DECOMPRESSION (N/A)  Patient location during evaluation: PACU Anesthesia Type: General Level of consciousness: sedated Pain management: satisfactory to patient Vital Signs Assessment: post-procedure vital signs reviewed and stable Respiratory status: spontaneous breathing Cardiovascular status: stable Anesthetic complications: no    Last Vitals:  Filed Vitals:   07/09/15 1034 07/09/15 1332  BP: 120/69 110/53  Pulse: 73 87  Temp: 36.4 C 37.1 C  Resp: 18 18    Last Pain:  Filed Vitals:   07/09/15 1333  PainSc: 8                  Azeneth Carbonell EDWARD

## 2015-07-09 NOTE — Progress Notes (Signed)
PA called back and verbal order received for home health PT.

## 2015-07-09 NOTE — Care Management Note (Signed)
Case Management Note  Patient Details  Name: Emily Hess MRN: [id number] Date of Birth: 1946/01/30  Subjective/Objective:                    Action/Plan: Patient discharging home with [facility] services. CM met with the family and provided them a list of [place] agencies in the [facility] area. They selected [facility]. Mary with [facility] notified and accepted the referral. Patient also ordered a rolling walker. Jermaine with [facility] notified and delivered the equipment to the room. Bedside RN updated.   Expected Discharge Date:                  Expected Discharge Plan:  [place]  In-House Referral:     Discharge planning Services  CM Consult  Post Acute Care Choice:  Durable Medical Equipment, Home Health Choice offered to:  Spouse  DME Arranged:  Walker rolling DME Agency:  [place] Arranged:  PT [place] Agency:  [facility]  Status of Service:  Completed, signed off  Medicare Important Message Given:    Date Medicare IM Given:    Medicare IM give by:    Date Additional Medicare IM Given:    Additional Medicare Important Message give by:     If discussed at [place] of Stay Meetings, dates discussed:    Additional Comments:  Pollie Friar, RN 07/09/2015, 1:37 PM

## 2015-07-09 NOTE — Progress Notes (Signed)
    Patient doing well, much improved compared to yesterday, states her pain is well controlled and she feels wonderful today. She has been up and walking. She is eating breakfast, NL B/B function.   Physical Exam: BP 99/56 mmHg  Pulse 93  Temp(Src) 97.9 F (36.6 C) (Oral)  Resp 18  Ht 5\' 3"  (1.6 m)  Wt 63.504 kg (140 lb)  BMI 24.81 kg/m2  SpO2 98%  Pt sitting up in bed eating breakfast comfortably, Dressing in place CDI, SCD's in place NVI  POD #2 s/p L3-5 Decompression admitted for pain control   - up with PT/OT, encourage ambulation - Percocet for pain, Valium for muscle spasms, oxycontin q12  -Scripts printed and in chart  - likely d/c home today post PT

## 2015-07-14 NOTE — Progress Notes (Signed)
PT G-Code Note   07/08/15 0945  PT G-Codes **NOT FOR INPATIENT CLASS**  Functional Assessment Tool Used Clinical Judgement  Functional Limitation Mobility: Walking and moving around  Mobility: Walking and Moving Around Current Status [phone]([id number]) CJ  Mobility: Walking and Moving Around Goal Status [phone](G8979) CI  Emily Hess, [place] CarolinaPT [phone] 07/14/2015

## 2015-08-04 NOTE — Discharge Summary (Signed)
Patient ID: Emily Hess MRN: [id number] DOB/AGE: 69-Aug-1947 69 y.o.  Admit date: 07/07/2015 Discharge date: 07/09/2015  Admission Diagnoses:  Active Problems:   Status post lumbar surgery   Status post lumbar spinal fusion   Discharge Diagnoses:  Same  Past Medical History  Diagnosis Date  . Liver cirrhosis secondary to NASH   . Seasonal allergies   . Heart murmur     MVP  . MVP (mitral valve prolapse)   . Hypothyroidism   . Anxiety   . Depression   . GERD (gastroesophageal reflux disease)   . Fibromyalgia   . Cataract     Surgeries: Procedure(s): LUMBAR 3-4, LUMBAR 4-5 DECOMPRESSION on 07/07/2015   Consultants:  None  Discharged Condition: Improved  Hospital Course: Emily Hess is an 70 y.o. female who was admitted 07/07/2015 for operative treatment of Spinal Stenosis. Patient has severe unremitting pain that affects sleep, daily activities, and work/hobbies. After pre-op clearance the patient was taken to the operating room on 07/07/2015 and underwent  Procedure(s): LUMBAR 3-4, LUMBAR 4-5 DECOMPRESSION.    Patient was given perioperative antibiotics:  Anti-infectives    Start     Dose/Rate Route Frequency Ordered Stop   07/07/15 1300  ceFAZolin (ANCEF) IVPB 2 g/50 mL premix     2 g 100 mL/hr over 30 Minutes Intravenous To [facility] 07/06/15 1053 07/07/15 1253       Patient was given sequential compression devices, early ambulation to prevent DVT.  Patient benefited maximally from hospital stay and there were no complications.    Recent vital signs: BP 110/53 mmHg  Pulse 87  Temp(Src) 98.7 F (37.1 C) (Oral)  Resp 18  Ht  (1.6 m)  Wt 63.504 kg (140 lb)  BMI 24.81 kg/m2  SpO2 94%  Discharge Medications:     Medication List    STOP taking these medications        cyclobenzaprine 10 MG tablet  Commonly known as:  FLEXERIL     diclofenac 75 MG EC tablet  Commonly known as:  VOLTAREN      TAKE these medications        ALLEGRA ALLERGY 180 MG tablet  Generic drug:  fexofenadine  Take 180 mg by mouth daily.     CALCIUM SOFT CHEWS PO  Take 1 capsule by mouth 2 (two) times daily.     cholecalciferol 1000 units tablet  Commonly known as:  VITAMIN D  Take 4,000 Units by mouth daily.     levothyroxine 88 MCG tablet  Commonly known as:  SYNTHROID, LEVOTHROID  Take 88 mcg by mouth at bedtime.     montelukast 10 MG tablet  Commonly known as:  SINGULAIR  Take 10 mg by mouth at bedtime.     oxyCODONE 20 mg 12 hr tablet  Commonly known as:  OXYCONTIN  Take 1 tablet (20 mg total) by mouth every 12 (twelve) hours.     oxyCODONE-acetaminophen 5-325 MG tablet  Commonly known as:  ROXICET  Take 1-2 tablets by mouth every 4 (four) hours as needed for moderate pain or severe pain.     promethazine 12.5 MG tablet  Commonly known as:  PHENERGAN  Take 1 tablet (12.5 mg total) by mouth every 6 (six) hours as needed for nausea or vomiting.     sertraline 100 MG tablet  Commonly known as:  ZOLOFT  Take 200 mg by mouth daily.     traZODone 50 MG tablet  Commonly known as:  DESYREL  Take 50 mg by mouth at bedtime.        Diagnostic Studies: Dg Chest 2 View  07/07/2015  CLINICAL DATA:  Lumbar laminectomy.  Preop. EXAM: CHEST  2 VIEW COMPARISON:  None. FINDINGS: Calcified granuloma in the right middle lobe has a benign appearance. Lungs are otherwise clear and hyperaerated. Normal heart size. No pneumothorax or pleural effusion. IMPRESSION: No active cardiopulmonary disease. Electronically Signed   By: Jolaine ClickArthur  Hoss M.D.   On: 07/07/2015 10:37   Dg Lumbar Spine 2-3 Views  07/07/2015  CLINICAL DATA:  Lumbar surgery, L3-4, L4-5 decompression EXAM: LUMBAR SPINE - 2-3 VIEW COMPARISON:  None. FINDINGS: First lateral intraoperative image demonstrates posterior needle directed between the L4 and L5 spinous processes. Second lateral intraoperative image demonstrates posterior surgical instruments at the L3-4 and L4-5  levels. IMPRESSION: Intraoperative localization as above. Electronically Signed   By: Charlett NoseKevin  Dover M.D.   On: 07/07/2015 15:57    Disposition: 01-Home or Self Care   POD #2 s/p L3-5 Decompression admitted for pain control   -Written scripts for pain signed and in chart -D/C instructions sheet printed and in chart -D/C today  -F/U in office 2 weeks   Signed: Georga BoraMCKENZIE, Malu Pellegrini J 08/04/2015, 9:57 AM

## 2015-10-23 DEATH — deceased

## 2017-11-03 IMAGING — CR DG CHEST 2V
2 series · 2 of 2 positions shown · non-contrast
Comparison: None.

CLINICAL DATA: Lumbar laminectomy.  Preop.

EXAM:
CHEST  2 VIEW

[w chest pa]
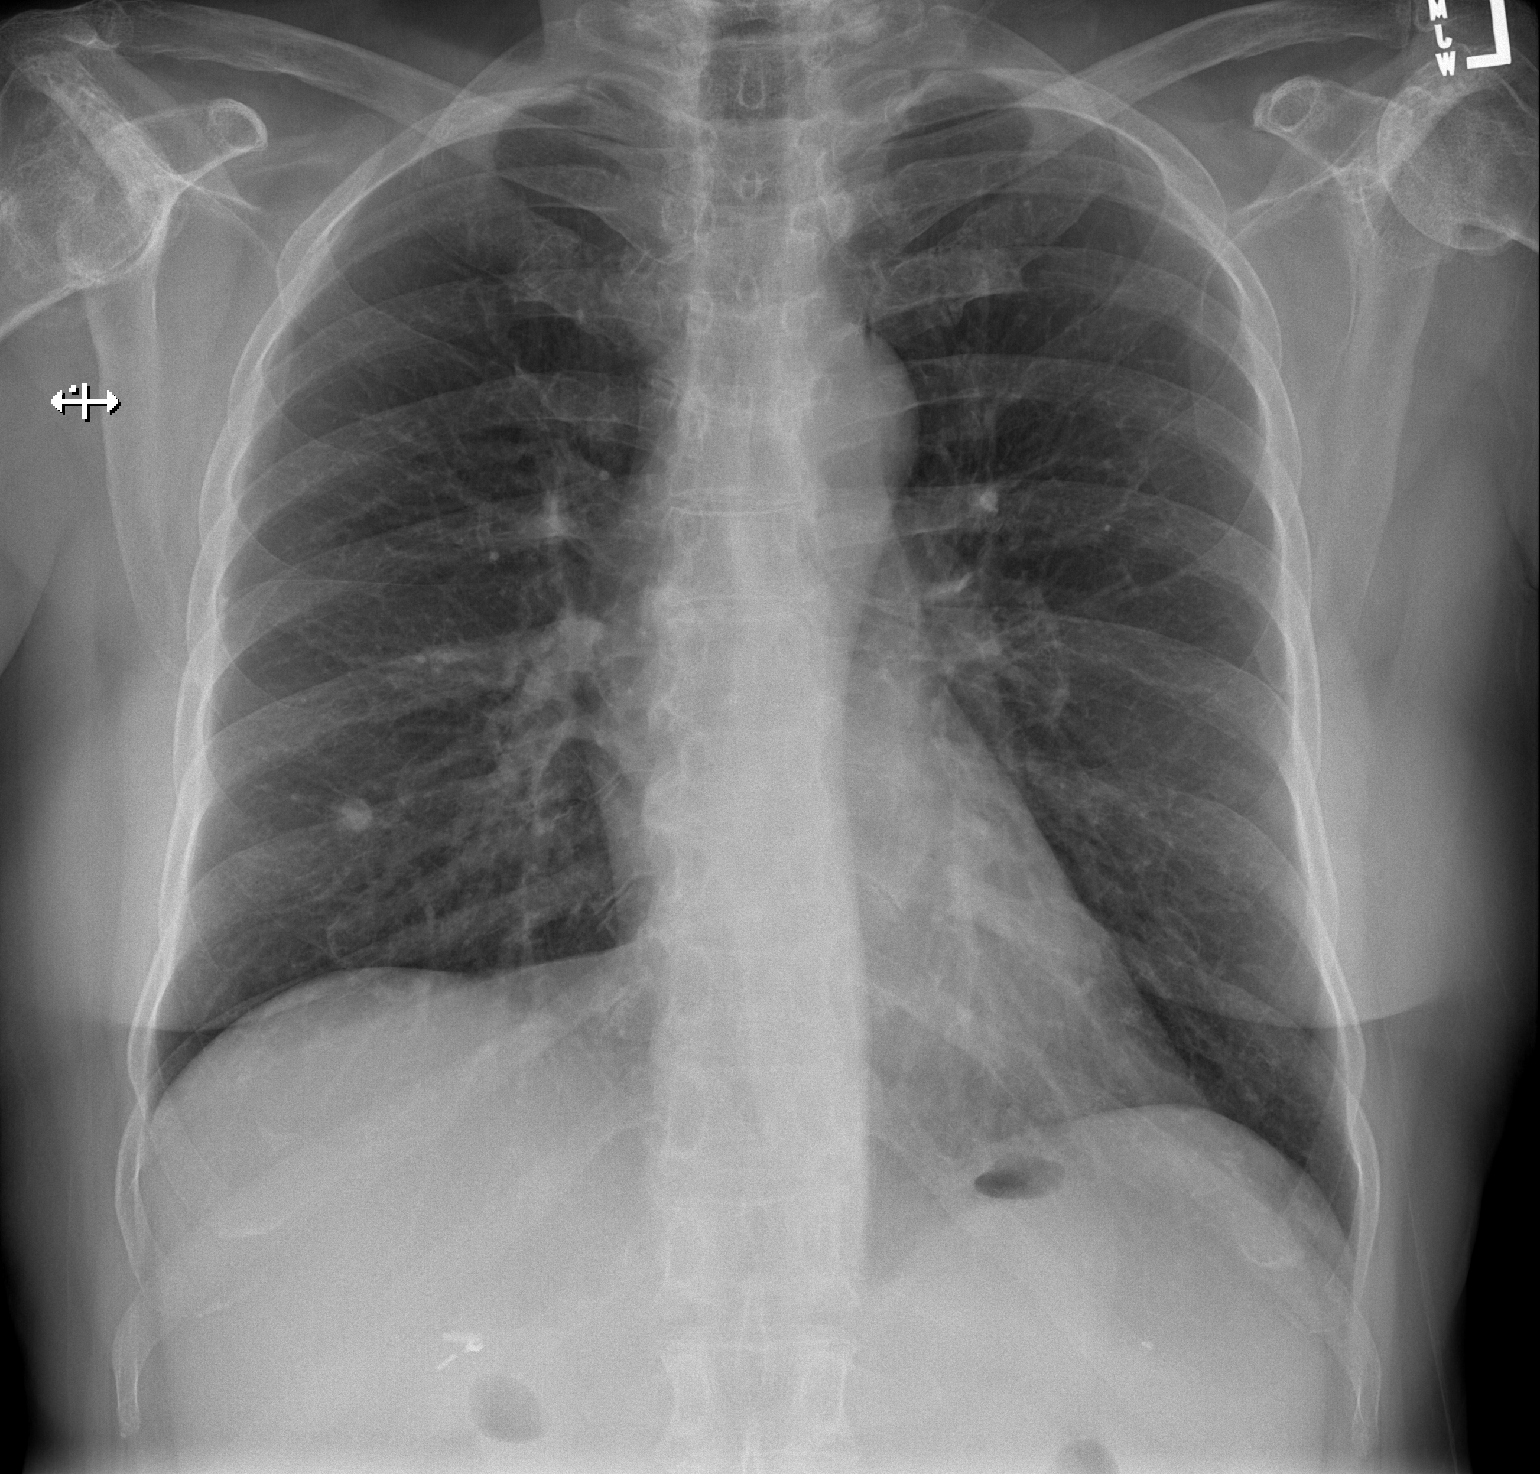

[w chest lat]
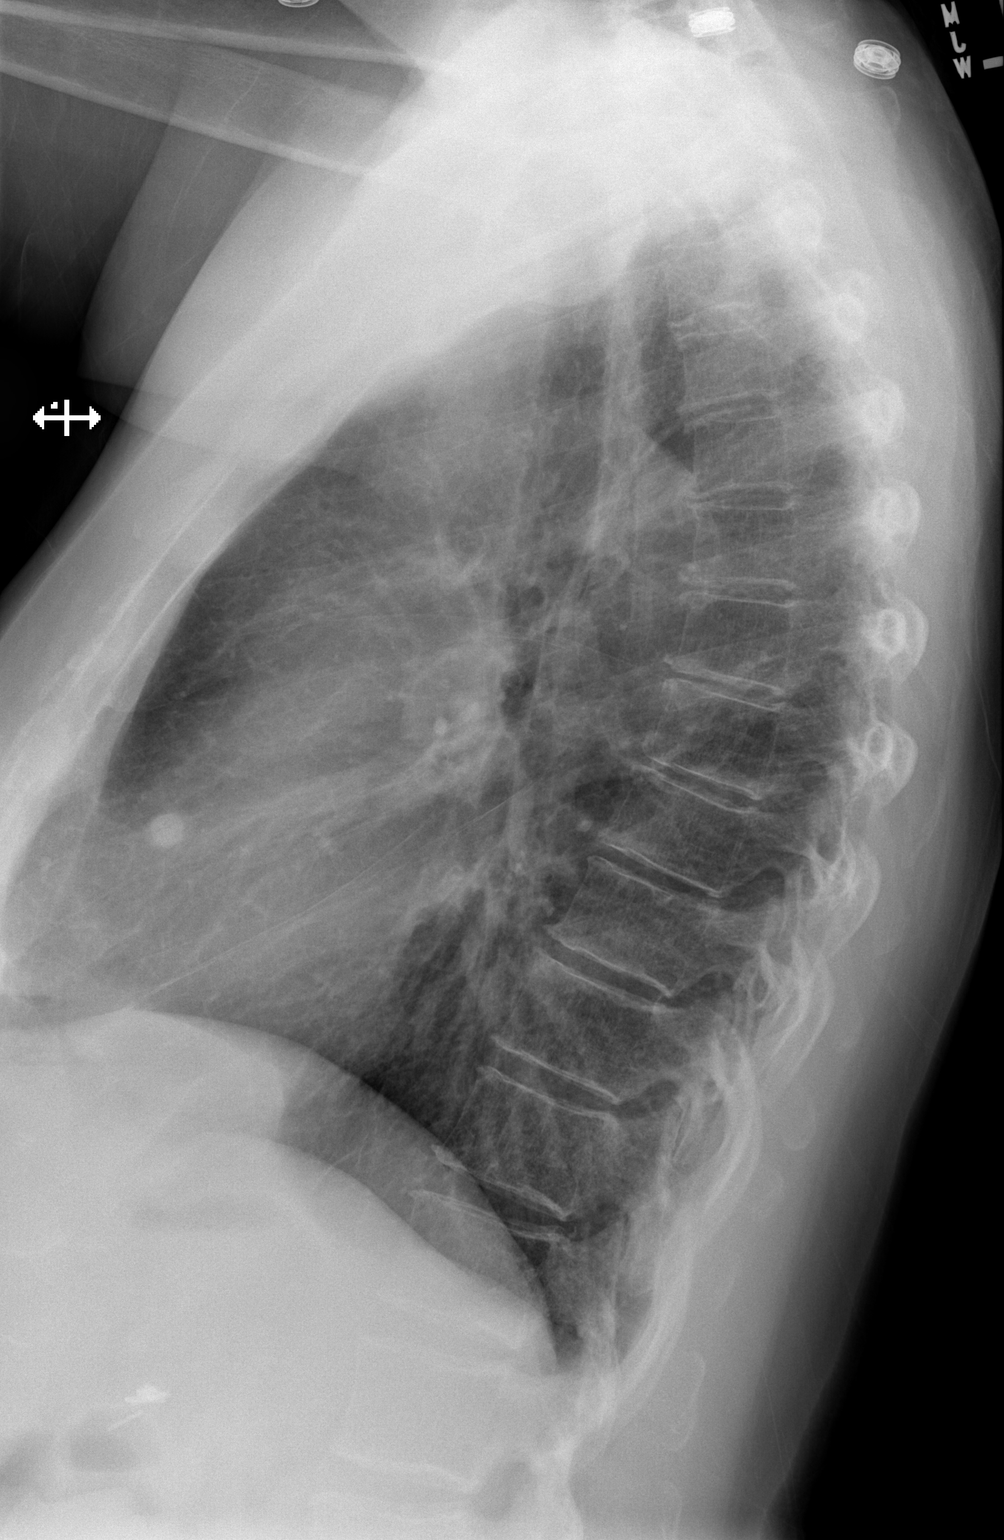

[2 of 2 positions shown; findings below may reference images not displayed]

FINDINGS: Calcified granuloma in the right middle lobe has a benign
appearance. Lungs are otherwise clear and hyperaerated. Normal heart
size. No pneumothorax or pleural effusion.
IMPRESSION: No active cardiopulmonary disease.

## 2017-12-26 ENCOUNTER — Other Ambulatory Visit: Payer: Self-pay | Admitting: Orthopedic Surgery

## 2017-12-28 NOTE — Pre-Procedure Instructions (Signed)
Emily Hess  12/28/2017      [facility] [facility] - [place], [place] - [address] [place] [address] Phone: [phone] Fax: [phone]  [id number] - [place], [place] - [address] AT [facility] [address] [place] [address] Phone: [phone] Fax: [phone]    Your procedure is scheduled on Wednesday September 11th.  Report to [facility] Admitting at 0600 A.M.  Call this number if you have problems the morning of surgery:  [phone]   Remember:  Do not eat or drink after midnight.    Take these medicines the morning of surgery with A SIP OF WATER   Eye Drops (if needed)  Allegra (if needed)  Gabapentin  Hydrocodone (if needed)  Synthroid  Zoloft  Tramadol (if needed)  7 days prior to surgery STOP taking any Aspirin(unless otherwise instructed by your surgeon), Aleve, Naproxen, Ibuprofen, Motrin, Advil, Goody's, BC's, all herbal medications, fish oil, and all vitamins     Do not wear jewelry, make-up or nail polish.  Do not wear lotions, powders, or perfumes, or deodorant.  Do not shave 48 hours prior to surgery.  Men may shave face and neck.  Do not bring valuables to the hospital.  [facility] is not responsible for any belongings or valuables.  Contacts, dentures or bridgework may not be worn into surgery.  Leave your suitcase in the car.  After surgery it may be brought to your room.  For patients admitted to the hospital, discharge time will be determined by your treatment team.  Patients discharged the day of surgery will not be allowed to drive home.    [facility]- Preparing For Surgery  Before surgery, you can play an important role. Because skin is not sterile, your skin needs to be as free of germs as possible. You can reduce the number of germs on your skin by washing with CHG (chlorahexidine gluconate) Soap before surgery.  CHG  is an antiseptic cleaner which kills germs and bonds with the skin to continue killing germs even after washing.    Oral Hygiene is also important to reduce your risk of infection.  Remember - BRUSH YOUR TEETH THE MORNING OF SURGERY WITH YOUR REGULAR TOOTHPASTE  Please do not use if you have an allergy to CHG or antibacterial soaps. If your skin becomes reddened/irritated stop using the CHG.  Do not shave (including legs and underarms) for at least 48 hours prior to first CHG shower. It is OK to shave your face.  Please follow these instructions carefully.   1. Shower the NIGHT BEFORE SURGERY and the MORNING OF SURGERY with CHG.   2. If you chose to wash your hair, wash your hair first as usual with your normal shampoo.  3. After you shampoo, rinse your hair and body thoroughly to remove the shampoo.  4. Use CHG as you would any other liquid soap. You can apply CHG directly to the skin and wash gently with a scrungie or a clean washcloth.   5. Apply the CHG Soap to your body ONLY FROM THE NECK DOWN.  Do not use on open wounds or open sores. Avoid contact with your eyes, ears, mouth and genitals (private parts). Wash Face and genitals (private parts)  with your normal soap.  6. Wash thoroughly, paying special attention to the area where your surgery will be performed.  7. Thoroughly rinse your  body with warm water from the neck down.  8. DO NOT shower/wash with your normal soap after using and rinsing off the CHG Soap.  9. Pat yourself dry with a CLEAN TOWEL.  10. Wear CLEAN PAJAMAS to bed the night before surgery, wear comfortable clothes the morning of surgery  11. Place CLEAN SHEETS on your bed the night of your first shower and DO NOT SLEEP WITH PETS.    Day of Surgery:  Do not apply any deodorants/lotions.  Please wear clean clothes to the hospital/surgery center.   Remember to brush your teeth WITH YOUR REGULAR TOOTHPASTE.    Please read over the following fact sheets  that you were given. Coughing and Deep Breathing, MRSA Information and Surgical Site Infection Prevention

## 2017-12-31 ENCOUNTER — Encounter (HOSPITAL_COMMUNITY): Payer: Self-pay

## 2017-12-31 ENCOUNTER — Encounter (HOSPITAL_COMMUNITY)
Admission: RE | Admit: 2017-12-31 | Discharge: 2017-12-31 | Disposition: A | Discharge status: Home / Self Care | Payer: Medicare Other | Source: Ambulatory Visit | Attending: Orthopedic Surgery | Admitting: Orthopedic Surgery

## 2017-12-31 ENCOUNTER — Other Ambulatory Visit: Payer: Self-pay

## 2017-12-31 HISTORY — DX: Presence of spectacles and contact lenses: Z97.3

## 2017-12-31 HISTORY — DX: Radiculopathy, site unspecified: M54.10

## 2017-12-31 LAB — URINALYSIS, ROUTINE W REFLEX MICROSCOPIC
Bacteria, UA: NONE SEEN
Bilirubin Urine: NEGATIVE
GLUCOSE, UA: NEGATIVE mg/dL
Hgb urine dipstick: NEGATIVE
Ketones, ur: NEGATIVE mg/dL
Leukocytes, UA: NEGATIVE
NITRITE: NEGATIVE
PH: 6 (ref 5.0–8.0)
PROTEIN: NEGATIVE mg/dL
Specific Gravity, Urine: 1.017 (ref 1.005–1.030)

## 2017-12-31 LAB — CBC WITH DIFFERENTIAL/PLATELET
Abs Immature Granulocytes: 0 10*3/uL (ref 0.0–0.1)
BASOS ABS: 0.1 10*3/uL (ref 0.0–0.1)
Basophils Relative: 1 %
EOS ABS: 0.2 10*3/uL (ref 0.0–0.7)
EOS PCT: 3 %
HEMATOCRIT: 39.8 % (ref 36.0–46.0)
Hemoglobin: 13.1 g/dL (ref 12.0–15.0)
IMMATURE GRANULOCYTES: 0 %
LYMPHS ABS: 1.7 10*3/uL (ref 0.7–4.0)
LYMPHS PCT: 24 %
MCH: 31.9 pg (ref 26.0–34.0)
MCHC: 32.9 g/dL (ref 30.0–36.0)
MCV: 96.8 fL (ref 78.0–100.0)
MONOS PCT: 10 %
Monocytes Absolute: 0.7 10*3/uL (ref 0.1–1.0)
Neutro Abs: 4.4 10*3/uL (ref 1.7–7.7)
Neutrophils Relative %: 62 %
Platelets: 255 10*3/uL (ref 150–400)
RBC: 4.11 MIL/uL (ref 3.87–5.11)
RDW: 12.4 % (ref 11.5–15.5)
WBC: 7.1 10*3/uL (ref 4.0–10.5)

## 2017-12-31 LAB — ABO/RH: ABO/RH(D): O POS

## 2017-12-31 LAB — COMPREHENSIVE METABOLIC PANEL
ALBUMIN: 4.2 g/dL (ref 3.5–5.0)
ALT: 26 U/L (ref 0–44)
ANION GAP: 12 (ref 5–15)
AST: 24 U/L (ref 15–41)
Alkaline Phosphatase: 61 U/L (ref 38–126)
BUN: 16 mg/dL (ref 8–23)
CHLORIDE: 97 mmol/L — AB (ref 98–111)
CO2: 23 mmol/L (ref 22–32)
Calcium: 9.3 mg/dL (ref 8.9–10.3)
Creatinine, Ser: 1.41 mg/dL — ABNORMAL HIGH (ref 0.44–1.00)
GFR calc Af Amer: 43 mL/min — ABNORMAL LOW (ref >60)
GFR calc non Af Amer: 37 mL/min — ABNORMAL LOW (ref >60)
GLUCOSE: 110 mg/dL — AB (ref 70–99)
POTASSIUM: 3.9 mmol/L (ref 3.5–5.1)
SODIUM: 132 mmol/L — AB (ref 135–145)
Total Bilirubin: 0.5 mg/dL (ref 0.3–1.2)
Total Protein: 7.2 g/dL (ref 6.5–8.1)

## 2017-12-31 LAB — SURGICAL PCR SCREEN
MRSA, PCR: NEGATIVE
Staphylococcus aureus: NEGATIVE

## 2017-12-31 LAB — TYPE AND SCREEN
ABO/RH(D): O POS
ANTIBODY SCREEN: NEGATIVE

## 2017-12-31 LAB — PROTIME-INR
INR: 1.03
Prothrombin Time: 13.4 seconds (ref 11.4–15.2)

## 2017-12-31 LAB — APTT: aPTT: 31 seconds (ref 24–36)

## 2017-12-31 NOTE — Progress Notes (Signed)
Pt denies SOB, chest pain, and being under the care of a cardiologist. Pt denies having a stress test, echo and cardiac cath. Pt denies having an EKG within the last year. Pt denies recent labs.

## 2017-12-31 NOTE — Pre-Procedure Instructions (Addendum)
   Emily Hess  12/31/2017      [facility] [address] [facility] - [place], [place] - [address] [place] [address] Phone: [phone] Fax: [phone]  [id number] - [place], [place] - [address] AT [facility] [address] [place] [address] Phone: [phone] Fax: [phone]    Your procedure is scheduled on Wednesday, January 02, 2018  Report to [facility] Admitting at 6:30 A.M.  Call this number if you have problems the morning of surgery:  [phone]   Remember:  Do not eat or drink after midnight Tuesday, January 02, 2018   Take these medicines the morning of surgery with A SIP OF WATER: l levothyroxine (SYNTHROID), sertraline (ZOLOFT), fexofenadine (ALLEGRA ALLERGY), gabapentin (NEURONTIN)  if needed: Ultram (tramadol) , REFRESH PLUS eye drops for dry eyes Stop taking Aspirin, ( unless advised otherwise by your surgeon), vitamins, fish oil, CBD Oil  and herbal medications. Do not take any NSAIDs ie: Ibuprofen, Advil, Naproxen (Aleve), Motrin, diclofenac (VOLTAREN), BC and Goody Powder ; stop now.  Do not wear jewelry, make-up or nail polish.  Do not wear lotions, powders, or perfumes, or deodorant.  Do not shave 48 hours prior to surgery.    Do not bring valuables to the hospital.  [facility] is not responsible for any belongings or valuables.  Contacts, dentures or bridgework may not be worn into surgery.  Leave your suitcase in the car.  After surgery it may be brought to your room. Patients discharged the day of surgery will not be allowed to drive home.  Special instructions: See " [facility]- Preparing For Surgery " sheet. Please read over the following fact sheets that you were given. Pain Booklet, Coughing and Deep Breathing and Surgical Site Infection Prevention

## 2018-01-01 ENCOUNTER — Encounter (HOSPITAL_COMMUNITY): Payer: Self-pay | Admitting: Anesthesiology

## 2018-01-01 NOTE — Anesthesia Preprocedure Evaluation (Addendum)
Anesthesia Evaluation  Patient identified by MRN, date of birth, ID band Patient awake    Reviewed: Allergy & Precautions, NPO status , Patient's Chart, lab work & pertinent test results  Airway Mallampati: II  TM Distance: >3 FB Neck ROM: Full    Dental  (+) Teeth Intact, Dental Advisory Given   Pulmonary    breath sounds clear to auscultation       Cardiovascular negative cardio ROS  + Valvular Problems/Murmurs MVP  Rhythm:Regular Rate:Normal     Neuro/Psych Anxiety Depression    GI/Hepatic GERD  ,(+) Cirrhosis       ,   Endo/Other  Hypothyroidism   Renal/GU negative Renal ROS     Musculoskeletal  (+) Fibromyalgia -  Abdominal Normal abdominal exam  (+)   Peds  Hematology negative hematology ROS (+)   Anesthesia Other Findings   Reproductive/Obstetrics                            EKG: NSR  Anesthesia Physical Anesthesia Plan  ASA: III  Anesthesia Plan: General   Post-op Pain Management:    Induction: Intravenous  PONV Risk Score and Plan: 4 or greater and Ondansetron, Dexamethasone, Treatment may vary due to age or medical condition and Midazolam  Airway Management Planned: Oral ETT  Additional Equipment: None  Intra-op Plan:   Post-operative Plan: Extubation in OR  Informed Consent: I have reviewed the patients History and Physical, chart, labs and discussed the procedure including the risks, benefits and alternatives for the proposed anesthesia with the patient or authorized representative who has indicated his/her understanding and acceptance.   Dental advisory given  Plan Discussed with: CRNA  Anesthesia Plan Comments:        Anesthesia Quick Evaluation

## 2018-01-02 ENCOUNTER — Inpatient Hospital Stay (HOSPITAL_COMMUNITY): Payer: Medicare Other

## 2018-01-02 ENCOUNTER — Inpatient Hospital Stay (HOSPITAL_COMMUNITY)
Admission: RE | Disposition: A | Discharge status: Home Health | Payer: Self-pay | Source: Ambulatory Visit | Attending: Orthopedic Surgery

## 2018-01-02 ENCOUNTER — Inpatient Hospital Stay (HOSPITAL_COMMUNITY)
Admission: RE | Admit: 2018-01-02 | Discharge: 2018-01-03 | DRG: 460 | Disposition: A | Discharge status: Home Health | Payer: Medicare Other | Source: Ambulatory Visit | Attending: Orthopedic Surgery | Admitting: Orthopedic Surgery

## 2018-01-02 ENCOUNTER — Inpatient Hospital Stay (HOSPITAL_COMMUNITY): Payer: Medicare Other | Admitting: Certified Registered Nurse Anesthetist

## 2018-01-02 ENCOUNTER — Encounter (HOSPITAL_COMMUNITY): Payer: Self-pay

## 2018-01-02 DIAGNOSIS — M5116 Intervertebral disc disorders with radiculopathy, lumbar region: Secondary | ICD-10-CM | POA: Diagnosis present

## 2018-01-02 DIAGNOSIS — Z882 Allergy status to sulfonamides status: Secondary | ICD-10-CM

## 2018-01-02 DIAGNOSIS — Z79890 Hormone replacement therapy (postmenopausal): Secondary | ICD-10-CM

## 2018-01-02 DIAGNOSIS — Z8249 Family history of ischemic heart disease and other diseases of the circulatory system: Secondary | ICD-10-CM

## 2018-01-02 DIAGNOSIS — E039 Hypothyroidism, unspecified: Secondary | ICD-10-CM | POA: Diagnosis present

## 2018-01-02 DIAGNOSIS — I341 Nonrheumatic mitral (valve) prolapse: Secondary | ICD-10-CM | POA: Diagnosis present

## 2018-01-02 DIAGNOSIS — K219 Gastro-esophageal reflux disease without esophagitis: Secondary | ICD-10-CM | POA: Diagnosis present

## 2018-01-02 DIAGNOSIS — F419 Anxiety disorder, unspecified: Secondary | ICD-10-CM | POA: Diagnosis present

## 2018-01-02 DIAGNOSIS — M4316 Spondylolisthesis, lumbar region: Secondary | ICD-10-CM | POA: Diagnosis present

## 2018-01-02 DIAGNOSIS — M797 Fibromyalgia: Secondary | ICD-10-CM | POA: Diagnosis present

## 2018-01-02 DIAGNOSIS — Z91018 Allergy to other foods: Secondary | ICD-10-CM

## 2018-01-02 DIAGNOSIS — Z79899 Other long term (current) drug therapy: Secondary | ICD-10-CM

## 2018-01-02 DIAGNOSIS — M48061 Spinal stenosis, lumbar region without neurogenic claudication: Secondary | ICD-10-CM | POA: Diagnosis present

## 2018-01-02 DIAGNOSIS — Z9049 Acquired absence of other specified parts of digestive tract: Secondary | ICD-10-CM | POA: Diagnosis not present

## 2018-01-02 DIAGNOSIS — Z419 Encounter for procedure for purposes other than remedying health state, unspecified: Secondary | ICD-10-CM

## 2018-01-02 DIAGNOSIS — Z886 Allergy status to analgesic agent status: Secondary | ICD-10-CM | POA: Diagnosis not present

## 2018-01-02 DIAGNOSIS — F329 Major depressive disorder, single episode, unspecified: Secondary | ICD-10-CM | POA: Diagnosis present

## 2018-01-02 DIAGNOSIS — M5410 Radiculopathy, site unspecified: Secondary | ICD-10-CM | POA: Diagnosis present

## 2018-01-02 HISTORY — PX: TRANSFORAMINAL LUMBAR INTERBODY FUSION (TLIF) WITH PEDICLE SCREW FIXATION 1 LEVEL: SHX6141

## 2018-01-02 SURGERY — TRANSFORAMINAL LUMBAR INTERBODY FUSION (TLIF) WITH PEDICLE SCREW FIXATION 1 LEVEL
Anesthesia: General | Laterality: Right

## 2018-01-02 MED ORDER — SUCCINYLCHOLINE CHLORIDE 20 MG/ML IJ SOLN
INTRAMUSCULAR | Status: DC | PRN
  Administered 2018-01-02: 140 mg via INTRAVENOUS

## 2018-01-02 MED ORDER — LIDOCAINE 2% (20 MG/ML) 5 ML SYRINGE
INTRAMUSCULAR | Status: DC | PRN
  Administered 2018-01-02: 50 mg via INTRAVENOUS

## 2018-01-02 MED ORDER — VITAMIN D3 25 MCG (1000 UNIT) PO TABS
5000.0000 [IU] | ORAL_TABLET | Freq: Every day | ORAL | Status: DC
  Filled 2018-01-02: qty 5

## 2018-01-02 MED ORDER — MIDAZOLAM HCL 2 MG/2ML IJ SOLN
INTRAMUSCULAR | Status: DC | PRN
  Administered 2018-01-02: 2 mg via INTRAVENOUS

## 2018-01-02 MED ORDER — THROMBIN 20000 UNITS EX SOLR
CUTANEOUS | Status: DC | PRN
  Administered 2018-01-02: 20000 [IU] via TOPICAL

## 2018-01-02 MED ORDER — SENNOSIDES-DOCUSATE SODIUM 8.6-50 MG PO TABS: 1.0000 | ORAL_TABLET | Freq: Every evening | ORAL | Status: DC | PRN

## 2018-01-02 MED ORDER — POLYVINYL ALCOHOL 1.4 % OP SOLN
1.0000 [drp] | Freq: Three times a day (TID) | OPHTHALMIC | Status: DC | PRN
  Filled 2018-01-02 (×2): qty 15

## 2018-01-02 MED ORDER — SODIUM CHLORIDE 0.9 % IV SOLN: 250.0000 mL | INTRAVENOUS | Status: DC

## 2018-01-02 MED ORDER — BUPIVACAINE-EPINEPHRINE (PF) 0.25% -1:200000 IJ SOLN
INTRAMUSCULAR | Status: AC
  Filled 2018-01-02: qty 30

## 2018-01-02 MED ORDER — DEXAMETHASONE SODIUM PHOSPHATE 10 MG/ML IJ SOLN
INTRAMUSCULAR | Status: DC | PRN
  Administered 2018-01-02: 10 mg via INTRAVENOUS

## 2018-01-02 MED ORDER — PROPOFOL 500 MG/50ML IV EMUL
INTRAVENOUS | Status: DC | PRN
  Administered 2018-01-02: 50 ug/kg/min via INTRAVENOUS

## 2018-01-02 MED ORDER — VANCOMYCIN HCL IN DEXTROSE 1-5 GM/200ML-% IV SOLN
1000.0000 mg | Freq: Once | INTRAVENOUS | Status: AC
  Administered 2018-01-02: 1000 mg via INTRAVENOUS
  Filled 2018-01-02: qty 200

## 2018-01-02 MED ORDER — ROCURONIUM BROMIDE 50 MG/5ML IV SOSY
PREFILLED_SYRINGE | INTRAVENOUS | Status: AC
  Filled 2018-01-02: qty 10

## 2018-01-02 MED ORDER — LIDOCAINE 2% (20 MG/ML) 5 ML SYRINGE
INTRAMUSCULAR | Status: AC
  Filled 2018-01-02: qty 5

## 2018-01-02 MED ORDER — ARTIFICIAL TEARS OPHTHALMIC OINT
TOPICAL_OINTMENT | OPHTHALMIC | Status: DC | PRN
  Administered 2018-01-02: 1 via OPHTHALMIC

## 2018-01-02 MED ORDER — OXYCODONE HCL 5 MG PO TABS
5.0000 mg | ORAL_TABLET | ORAL | Status: DC | PRN
  Administered 2018-01-02 – 2018-01-03 (×6): 10 mg via ORAL
  Filled 2018-01-02 (×5): qty 2

## 2018-01-02 MED ORDER — VITAMIN B-12 1000 MCG PO TABS
1000.0000 ug | ORAL_TABLET | Freq: Every day | ORAL | Status: DC
  Filled 2018-01-02: qty 1

## 2018-01-02 MED ORDER — HYDROMORPHONE HCL 1 MG/ML IJ SOLN
INTRAMUSCULAR | Status: AC
  Filled 2018-01-02: qty 1

## 2018-01-02 MED ORDER — ONDANSETRON HCL 4 MG/2ML IJ SOLN
INTRAMUSCULAR | Status: AC
  Filled 2018-01-02: qty 2

## 2018-01-02 MED ORDER — PROMETHAZINE HCL 25 MG/ML IJ SOLN: 6.2500 mg | INTRAMUSCULAR | Status: DC | PRN

## 2018-01-02 MED ORDER — PHENYLEPHRINE 40 MCG/ML (10ML) SYRINGE FOR IV PUSH (FOR BLOOD PRESSURE SUPPORT)
PREFILLED_SYRINGE | INTRAVENOUS | Status: DC | PRN
  Administered 2018-01-02 (×2): 200 ug via INTRAVENOUS
  Administered 2018-01-02: 40 ug via INTRAVENOUS

## 2018-01-02 MED ORDER — SERTRALINE HCL 50 MG PO TABS
200.0000 mg | ORAL_TABLET | Freq: Every day | ORAL | Status: DC
  Administered 2018-01-03: 200 mg via ORAL
  Filled 2018-01-02: qty 4

## 2018-01-02 MED ORDER — PROPOFOL 10 MG/ML IV BOLUS
INTRAVENOUS | Status: DC | PRN
  Administered 2018-01-02: 110 mg via INTRAVENOUS

## 2018-01-02 MED ORDER — ZOLPIDEM TARTRATE 5 MG PO TABS: 5.0000 mg | ORAL_TABLET | Freq: Every evening | ORAL | Status: DC | PRN

## 2018-01-02 MED ORDER — LACTATED RINGERS IV SOLN
INTRAVENOUS | Status: DC
  Administered 2018-01-02 (×2): via INTRAVENOUS

## 2018-01-02 MED ORDER — BUPIVACAINE LIPOSOME 1.3 % IJ SUSP
20.0000 mL | INTRAMUSCULAR | Status: AC
  Administered 2018-01-02: 20 mL
  Filled 2018-01-02: qty 20

## 2018-01-02 MED ORDER — MIDAZOLAM HCL 2 MG/2ML IJ SOLN
INTRAMUSCULAR | Status: AC
  Filled 2018-01-02: qty 2

## 2018-01-02 MED ORDER — FENTANYL CITRATE (PF) 250 MCG/5ML IJ SOLN
INTRAMUSCULAR | Status: AC
  Filled 2018-01-02: qty 5

## 2018-01-02 MED ORDER — CALCIUM CARBONATE 1500 (600 CA) MG PO TABS
600.0000 mg | ORAL_TABLET | Freq: Every day | ORAL | Status: DC
  Filled 2018-01-02: qty 1

## 2018-01-02 MED ORDER — SODIUM CHLORIDE 0.9% FLUSH: 3.0000 mL | Freq: Two times a day (BID) | INTRAVENOUS | Status: DC

## 2018-01-02 MED ORDER — TRAZODONE HCL 150 MG PO TABS
150.0000 mg | ORAL_TABLET | Freq: Every day | ORAL | Status: DC
  Administered 2018-01-02: 150 mg via ORAL
  Filled 2018-01-02: qty 1

## 2018-01-02 MED ORDER — MENTHOL 3 MG MT LOZG: 1.0000 | LOZENGE | OROMUCOSAL | Status: DC | PRN

## 2018-01-02 MED ORDER — FLEET ENEMA 7-19 GM/118ML RE ENEM: 1.0000 | ENEMA | Freq: Once | RECTAL | Status: DC | PRN

## 2018-01-02 MED ORDER — OXYCODONE HCL 5 MG PO TABS
ORAL_TABLET | ORAL | Status: AC
  Filled 2018-01-02: qty 2

## 2018-01-02 MED ORDER — BUPIVACAINE-EPINEPHRINE 0.25% -1:200000 IJ SOLN
INTRAMUSCULAR | Status: DC | PRN
  Administered 2018-01-02: 10 mL
  Administered 2018-01-02: 20 mL

## 2018-01-02 MED ORDER — SUCCINYLCHOLINE CHLORIDE 200 MG/10ML IV SOSY
PREFILLED_SYRINGE | INTRAVENOUS | Status: AC
  Filled 2018-01-02: qty 10

## 2018-01-02 MED ORDER — VANCOMYCIN HCL IN DEXTROSE 1-5 GM/200ML-% IV SOLN
INTRAVENOUS | Status: AC
  Filled 2018-01-02: qty 200

## 2018-01-02 MED ORDER — LACTATED RINGERS IV SOLN
INTRAVENOUS | Status: DC | PRN
  Administered 2018-01-02: 09:00:00 via INTRAVENOUS

## 2018-01-02 MED ORDER — THROMBIN (RECOMBINANT) 20000 UNITS EX SOLR
CUTANEOUS | Status: AC
  Filled 2018-01-02: qty 20000

## 2018-01-02 MED ORDER — ONDANSETRON HCL 4 MG/2ML IJ SOLN
INTRAMUSCULAR | Status: DC | PRN
  Administered 2018-01-02 (×2): 4 mg via INTRAVENOUS

## 2018-01-02 MED ORDER — ROCURONIUM BROMIDE 10 MG/ML (PF) SYRINGE
PREFILLED_SYRINGE | INTRAVENOUS | Status: DC | PRN
  Administered 2018-01-02: 20 mg via INTRAVENOUS
  Administered 2018-01-02: 40 mg via INTRAVENOUS

## 2018-01-02 MED ORDER — PHENOL 1.4 % MT LIQD: 1.0000 | OROMUCOSAL | Status: DC | PRN

## 2018-01-02 MED ORDER — HYDROMORPHONE HCL 1 MG/ML IJ SOLN
0.2500 mg | INTRAMUSCULAR | Status: DC | PRN
  Administered 2018-01-02 (×4): 0.5 mg via INTRAVENOUS

## 2018-01-02 MED ORDER — GABAPENTIN 100 MG PO CAPS
100.0000 mg | ORAL_CAPSULE | Freq: Three times a day (TID) | ORAL | Status: DC
  Administered 2018-01-02 – 2018-01-03 (×2): 100 mg via ORAL
  Filled 2018-01-02 (×2): qty 1

## 2018-01-02 MED ORDER — POTASSIUM CHLORIDE IN NACL 20-0.9 MEQ/L-% IV SOLN: INTRAVENOUS | Status: DC

## 2018-01-02 MED ORDER — 0.9 % SODIUM CHLORIDE (POUR BTL) OPTIME
TOPICAL | Status: DC | PRN
  Administered 2018-01-02: 1000 mL
  Administered 2018-01-02: 2000 mL

## 2018-01-02 MED ORDER — LACTATED RINGERS IV SOLN: INTRAVENOUS | Status: DC

## 2018-01-02 MED ORDER — DEXAMETHASONE SODIUM PHOSPHATE 10 MG/ML IJ SOLN
INTRAMUSCULAR | Status: AC
  Filled 2018-01-02: qty 1

## 2018-01-02 MED ORDER — VANCOMYCIN HCL IN DEXTROSE 1-5 GM/200ML-% IV SOLN
1000.0000 mg | INTRAVENOUS | Status: AC
  Administered 2018-01-02: 1000 mg via INTRAVENOUS

## 2018-01-02 MED ORDER — ALUM & MAG HYDROXIDE-SIMETH 200-200-20 MG/5ML PO SUSP: 30.0000 mL | Freq: Four times a day (QID) | ORAL | Status: DC | PRN

## 2018-01-02 MED ORDER — LACTATED RINGERS IV SOLN: INTRAVENOUS | Status: DC | PRN

## 2018-01-02 MED ORDER — SODIUM CHLORIDE 0.9 % IV SOLN
INTRAVENOUS | Status: DC | PRN
  Administered 2018-01-02: 30 ug/min via INTRAVENOUS

## 2018-01-02 MED ORDER — MORPHINE SULFATE (PF) 2 MG/ML IV SOLN
1.0000 mg | INTRAVENOUS | Status: DC | PRN
  Administered 2018-01-02: 2 mg via INTRAVENOUS
  Filled 2018-01-02: qty 1

## 2018-01-02 MED ORDER — DOCUSATE SODIUM 100 MG PO CAPS
100.0000 mg | ORAL_CAPSULE | Freq: Two times a day (BID) | ORAL | Status: DC
  Administered 2018-01-02 – 2018-01-03 (×2): 100 mg via ORAL
  Filled 2018-01-02 (×2): qty 1

## 2018-01-02 MED ORDER — POVIDONE-IODINE 7.5 % EX SOLN
Freq: Once | CUTANEOUS | Status: DC
  Filled 2018-01-02: qty 118

## 2018-01-02 MED ORDER — LEVOTHYROXINE SODIUM 88 MCG PO TABS
88.0000 ug | ORAL_TABLET | Freq: Every day | ORAL | Status: DC
  Administered 2018-01-03: 88 ug via ORAL
  Filled 2018-01-02 (×2): qty 1

## 2018-01-02 MED ORDER — DIAZEPAM 5 MG PO TABS
5.0000 mg | ORAL_TABLET | Freq: Four times a day (QID) | ORAL | Status: DC | PRN
  Administered 2018-01-02: 5 mg via ORAL
  Filled 2018-01-02: qty 1

## 2018-01-02 MED ORDER — CARBOXYMETHYLCELLULOSE SODIUM 0.5 % OP SOLN: 1.0000 [drp] | Freq: Three times a day (TID) | OPHTHALMIC | Status: DC | PRN

## 2018-01-02 MED ORDER — DIPHENOXYLATE-ATROPINE 2.5-0.025 MG PO TABS: 1.0000 | ORAL_TABLET | Freq: Four times a day (QID) | ORAL | Status: DC | PRN

## 2018-01-02 MED ORDER — PROPOFOL 10 MG/ML IV BOLUS
INTRAVENOUS | Status: AC
  Filled 2018-01-02: qty 20

## 2018-01-02 MED ORDER — MEPERIDINE HCL 50 MG/ML IJ SOLN
6.2500 mg | INTRAMUSCULAR | Status: DC | PRN
  Administered 2018-01-02: 6.25 mg via INTRAVENOUS

## 2018-01-02 MED ORDER — MONTELUKAST SODIUM 10 MG PO TABS
10.0000 mg | ORAL_TABLET | Freq: Every day | ORAL | Status: DC
  Administered 2018-01-02: 10 mg via ORAL
  Filled 2018-01-02 (×2): qty 1

## 2018-01-02 MED ORDER — METHYLENE BLUE 0.5 % INJ SOLN
INTRAVENOUS | Status: AC
  Filled 2018-01-02: qty 10

## 2018-01-02 MED ORDER — FENTANYL CITRATE (PF) 250 MCG/5ML IJ SOLN
INTRAMUSCULAR | Status: DC | PRN
  Administered 2018-01-02: 100 ug via INTRAVENOUS
  Administered 2018-01-02 (×2): 50 ug via INTRAVENOUS

## 2018-01-02 MED ORDER — HEMOSTATIC AGENTS (NO CHARGE) OPTIME
TOPICAL | Status: DC | PRN
  Administered 2018-01-02: 1 via TOPICAL

## 2018-01-02 MED ORDER — DICYCLOMINE HCL 20 MG PO TABS
20.0000 mg | ORAL_TABLET | Freq: Three times a day (TID) | ORAL | Status: DC | PRN
  Filled 2018-01-02: qty 1

## 2018-01-02 MED ORDER — SODIUM CHLORIDE 0.9% FLUSH: 3.0000 mL | INTRAVENOUS | Status: DC | PRN

## 2018-01-02 MED ORDER — MEPERIDINE HCL 50 MG/ML IJ SOLN
INTRAMUSCULAR | Status: AC
  Filled 2018-01-02: qty 1

## 2018-01-02 SURGICAL SUPPLY — 86 items
BENZOIN TINCTURE PRP APPL 2/3 (GAUZE/BANDAGES/DRESSINGS) ×3 IMPLANT
BLADE CLIPPER SURG (BLADE) IMPLANT
BONE VIVIGEN FORMABLE 10CC (Bone Implant) ×3 IMPLANT
BUR PRESCISION 1.7 ELITE (BURR) ×3 IMPLANT
BUR ROUND FLUTED 5 RND (BURR) ×2 IMPLANT
BUR ROUND FLUTED 5MM RND (BURR) ×1
BUR ROUND PRECISION 4.0 (BURR) IMPLANT
BUR ROUND PRECISION 4.0MM (BURR)
BUR SABER RD CUTTING 3.0 (BURR) IMPLANT
BUR SABER RD CUTTING 3.0MM (BURR)
CAGE CONCORDE BULLET 9X11X27 (Cage) ×1 IMPLANT
CAGE CONCORDE BULLET 9X11X27MM (Cage) ×1 IMPLANT
CAGE SPNL 5D BLT NOSE 27X9X11 (Cage) ×1 IMPLANT
CARTRIDGE OIL MAESTRO DRILL (MISCELLANEOUS) ×1 IMPLANT
CLOSURE WOUND 1/2 X4 (GAUZE/BANDAGES/DRESSINGS) ×2
CONT SPEC 4OZ CLIKSEAL STRL BL (MISCELLANEOUS) ×3 IMPLANT
COVER MAYO STAND STRL (DRAPES) ×6 IMPLANT
COVER SURGICAL LIGHT HANDLE (MISCELLANEOUS) ×3 IMPLANT
DIFFUSER DRILL AIR PNEUMATIC (MISCELLANEOUS) ×3 IMPLANT
DRAIN CHANNEL 15F RND FF W/TCR (WOUND CARE) IMPLANT
DRAPE C-ARM 42X72 X-RAY (DRAPES) ×3 IMPLANT
DRAPE C-ARMOR (DRAPES) IMPLANT
DRAPE POUCH INSTRU U-SHP 10X18 (DRAPES) ×3 IMPLANT
DRAPE SURG 17X23 STRL (DRAPES) ×12 IMPLANT
DURAPREP 26ML APPLICATOR (WOUND CARE) ×3 IMPLANT
ELECT BLADE 4.0 EZ CLEAN MEGAD (MISCELLANEOUS) ×3
ELECT CAUTERY BLADE 6.4 (BLADE) ×3 IMPLANT
ELECT REM PT RETURN 9FT ADLT (ELECTROSURGICAL) ×3
ELECTRODE BLDE 4.0 EZ CLN MEGD (MISCELLANEOUS) ×1 IMPLANT
ELECTRODE REM PT RTRN 9FT ADLT (ELECTROSURGICAL) ×1 IMPLANT
EVACUATOR SILICONE 100CC (DRAIN) IMPLANT
FEE INTRAOP MONITOR IMPULS NCS (MISCELLANEOUS) ×1 IMPLANT
FILTER STRAW FLUID ASPIR (MISCELLANEOUS) ×3 IMPLANT
GAUZE 4X4 16PLY RFD (DISPOSABLE) ×3 IMPLANT
GAUZE SPONGE 4X4 12PLY STRL (GAUZE/BANDAGES/DRESSINGS) ×3 IMPLANT
GLOVE BIO SURGEON STRL SZ7 (GLOVE) ×3 IMPLANT
GLOVE BIO SURGEON STRL SZ8 (GLOVE) ×3 IMPLANT
GLOVE BIOGEL PI IND STRL 7.0 (GLOVE) ×1 IMPLANT
GLOVE BIOGEL PI IND STRL 8 (GLOVE) ×1 IMPLANT
GLOVE BIOGEL PI INDICATOR 7.0 (GLOVE) ×2
GLOVE BIOGEL PI INDICATOR 8 (GLOVE) ×2
GOWN STRL REUS W/ TWL LRG LVL3 (GOWN DISPOSABLE) ×2 IMPLANT
GOWN STRL REUS W/ TWL XL LVL3 (GOWN DISPOSABLE) ×1 IMPLANT
GOWN STRL REUS W/TWL LRG LVL3 (GOWN DISPOSABLE) ×4
GOWN STRL REUS W/TWL XL LVL3 (GOWN DISPOSABLE) ×2
INTRAOP MONITOR FEE IMPULS NCS (MISCELLANEOUS) ×1
INTRAOP MONITOR FEE IMPULSE (MISCELLANEOUS) ×2
IV CATH 14GX2 1/4 (CATHETERS) ×3 IMPLANT
KIT BASIN OR (CUSTOM PROCEDURE TRAY) ×3 IMPLANT
KIT POSITION SURG JACKSON T1 (MISCELLANEOUS) ×3 IMPLANT
KIT TURNOVER KIT B (KITS) ×3 IMPLANT
MARKER SKIN DUAL TIP RULER LAB (MISCELLANEOUS) ×6 IMPLANT
NDL SAFETY ECLIPSE 18X1.5 (NEEDLE) ×1 IMPLANT
NEEDLE 22X1 1/2 (OR ONLY) (NEEDLE) ×6 IMPLANT
NEEDLE HYPO 18GX1.5 SHARP (NEEDLE) ×2
NEEDLE HYPO 25GX1X1/2 BEV (NEEDLE) ×3 IMPLANT
NEEDLE SPNL 18GX3.5 QUINCKE PK (NEEDLE) ×6 IMPLANT
NS IRRIG 1000ML POUR BTL (IV SOLUTION) ×3 IMPLANT
OIL CARTRIDGE MAESTRO DRILL (MISCELLANEOUS) ×3
PACK LAMINECTOMY ORTHO (CUSTOM PROCEDURE TRAY) ×3 IMPLANT
PACK UNIVERSAL I (CUSTOM PROCEDURE TRAY) ×3 IMPLANT
PAD ARMBOARD 7.5X6 YLW CONV (MISCELLANEOUS) ×6 IMPLANT
PATTIES SURGICAL .5 X.5 (GAUZE/BANDAGES/DRESSINGS) ×3 IMPLANT
PATTIES SURGICAL .5 X1 (DISPOSABLE) ×3 IMPLANT
PATTIES SURGICAL .5X1.5 (GAUZE/BANDAGES/DRESSINGS) ×3 IMPLANT
ROD PRE BENT EXP 40MM (Rod) ×6 IMPLANT
SCREW CORTICAL VIPER 7X40MM (Screw) ×12 IMPLANT
SCREW SET SINGLE INNER (Screw) ×12 IMPLANT
SPONGE INTESTINAL PEANUT (DISPOSABLE) ×3 IMPLANT
SPONGE SURGIFOAM ABS GEL 100 (HEMOSTASIS) ×3 IMPLANT
STRIP CLOSURE SKIN 1/2X4 (GAUZE/BANDAGES/DRESSINGS) ×4 IMPLANT
SURGIFLO W/THROMBIN 8M KIT (HEMOSTASIS) ×3 IMPLANT
SUT MNCRL AB 4-0 PS2 18 (SUTURE) ×3 IMPLANT
SUT VIC AB 0 CT1 18XCR BRD 8 (SUTURE) ×2 IMPLANT
SUT VIC AB 0 CT1 8-18 (SUTURE) ×4
SUT VIC AB 1 CT1 18XCR BRD 8 (SUTURE) ×1 IMPLANT
SUT VIC AB 1 CT1 8-18 (SUTURE) ×2
SUT VIC AB 2-0 CT2 18 VCP726D (SUTURE) ×6 IMPLANT
SYR 20CC LL (SYRINGE) ×6 IMPLANT
SYR BULB IRRIGATION 50ML (SYRINGE) ×3 IMPLANT
SYR CONTROL 10ML LL (SYRINGE) ×6 IMPLANT
SYR TB 1ML LUER SLIP (SYRINGE) ×3 IMPLANT
TAPE CLOTH SURG 4X10 WHT LF (GAUZE/BANDAGES/DRESSINGS) ×3 IMPLANT
TRAY FOLEY MTR SLVR 16FR STAT (SET/KITS/TRAYS/PACK) ×3 IMPLANT
WATER STERILE IRR 1000ML POUR (IV SOLUTION) ×3 IMPLANT
YANKAUER SUCT BULB TIP NO VENT (SUCTIONS) ×3 IMPLANT

## 2018-01-02 NOTE — Progress Notes (Signed)
Called by PACU RN and notified of patient reporting chest discomfort and shortness of breath. Vital signs stable. ECG ordered and reviewed, no obviously acute cardiac abnormality. ABG ordered and reviewed, no obvious acute pulmonary abnormality. Lungs clear to ascultation bilaterally. Will transfer to floor.

## 2018-01-02 NOTE — Op Note (Signed)
MEDICAL RECORD NO.: [id number]   PHYSICIAN:  Estill Bamberg, MD      DATE OF BIRTH:  07-Nov-1945   DATE OF PROCEDURE:  01/02/2018                               OPERATIVE REPORT     PREOPERATIVE DIAGNOSES: 1. Right-sided lumbar radiculopathy. 2. 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 3. Severe L4-5 degenerative disc disease 4. Grade 2 L4-5 spondylolisthesis 5. Status post previous L3-L5 decompression   POSTOPERATIVE DIAGNOSES: 1. Right-sided lumbar radiculopathy. 2. 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 3. Severe L4-5 degenerative disc disease 4. Grade 2 L4-5 spondylolisthesis 5. Status post previous L3-L5 decompression   PROCEDURES: 1. Complex revision L4/5 decompression 2. Right-sided L4-5 transforaminal lumbar interbody fusion. 3. Left-sided L4-5 posterolateral fusion. 4. Insertion of interbody device x1 (11 mm lorditic Concorde intervertebral spacer). 5. Placement of segmental posterior instrumentation L4, L5 bilaterally  6. Use of local autograft. 7. Use of morselized allograft - Vivigen 8. Intraoperative use of fluoroscopy.   SURGEON:  Estill Bamberg, MD.   ASSISTANTJason Coop, PA-C.   ANESTHESIA:  General endotracheal anesthesia.   COMPLICATIONS:  None.   DISPOSITION:  Stable.   ESTIMATED BLOOD LOSS:  250cc   INDICATIONS FOR SURGERY:  Briefly,  Emily Hess is a pleasant 72 year old female who did present to me with severe and ongoing pain in the right leg.   As noted above, the patient is status post a previous decompression, spanning L3-L5.  She did very well following that procedure, which was done for left leg pain.  More recently, she has been having severe pain in the right leg.   An updated MRI did reveal a grade 2 L4-5 spondylolisthesis, with prominent right-sided L4-5 facet hypertrophy, with compression of the right L4 and L5 nerves. I did feel that the symptoms were secondary to the findings noted above.   The patient failed conservative care and did wish to  proceed with the procedure  noted above.   OPERATIVE DETAILS:  On  01/02/2018, the patient was brought to surgery and general endotracheal anesthesia was administered.  The patient was placed prone on a well-padded flat Jackson bed with a Wiilson frame.  Antibiotics were given and a time-out procedure was performed. The back was prepped and draped in the usual fashion. A midline incision was made overlying the L4-5 intervertebral spaces. The fascia was incised at the midline.  The paraspinal musculature was bluntly swept laterally.  Extensive scar tissue was noted about the epidural space. I was however able to identify the anatomic landmarks for the pedicles of L4 and L5 bilaterally. Using fluoroscopy, I did cannulate the L5 pedicles bilaterally, using a straight/medial cortical trajectory technique.  Then, on the right side, I did cannulate the L4 and L5 pedicles, also using a medial to lateral technique.  At this point, 7 x 40 mm screws were placed into the left pedicles, and a 40 mm rod was placed into the tulip heads of the screw, and saps were also placed.  Distraction was then applied across the L4-5 intervertebral space, and the caps were then provisionally tightened.  On the right side, bone wax was placed into the cannulated pedicle holes.  Of note, the patient's bone quality was noted to be very suboptimal.  The purchase of the screws were suboptimal as well, as it was clear that the patient did have rather soft bone. I then proceeded with the decompressive  aspect of the procedure at the L4-5 level.  Given the extensive scar tissue noted about the epidural space, this was a very meticulous portion of the procedure, taking substantially more time than a typical decompressive procedure.  Minor portions of bone were entirely adherent to the dura, but were noncompressive, so were not removed, as I did feel that removal of certain portions of the facet joint with substantial increase the risk of  durotomy or neurologic injury.  Ultimately, a partial facetectomy was performed bilaterally at L4-5, decompressing the L4-5 intervertebral space.  I was very pleased with the decompression. With the assistant holding medial retraction of the traversing right L5 nerve, I did perform an annulotomy at the posterolateral aspect of the L4-5 intervertebral space.  I then used a series of curettes and pituitary rongeurs to perform a thorough and complete intervertebral diskectomy.  The intervertebral space was then liberally packed with autograft as well as allograft in the form of Vivigen, as was the appropriate-sized intervertebral spacer (11 mm, lordotic).  The spacer was then tamped into position in the usual fashion.  I was pleased with the press-fit of the spacer.  Of note, as per the patient's preoperative MRI, it was clear that there was a cavitary defect centrally in the L4 vertebral body.  I did advance the patient's intervertebral implant across the defect, in order to ensure the most optimal degree of structural integrity of the intervertebral implant. I then placed 7 mm screws on the right at L4 and L5. A 40-mm rod was then placed and caps were placed. The distraction was then released on the contralateral left side.  All caps were then locked.  The wound was copiously irrigated with a total of approximately 3 L prior to placing the bone graft.  Additional autograft and allograft was then packed into the posterolateral gutter on the left side to help aid in the success of the fusion.  The wound was  explored for any undue bleeding and there was no substantial bleeding encountered.  Gel-Foam was placed over the laminectomy site.  The wound was then closed in layers using #1 Vicryl followed by 2-0 Vicryl, followed by 4-0 Monocryl.  Benzoin and Steri-Strips were applied followed by sterile dressing.   Of note, did use triggered EMG to test the screws on the left, and there is no screw the tested  below 20 mA. There was no sustained abnormal EMG activity noted throughout the surgery.   Of note, Jason Coop was my assistant throughout surgery, and did aid in retraction, suctioning, and closure.       Estill Bamberg, MD

## 2018-01-02 NOTE — Transfer of Care (Signed)
Immediate Anesthesia Transfer of Care Note  Patient: Emily Hess  Procedure(s) Performed: RIGHT-SIDED LUMBAR 4-5 TRANSFORAMINAL LUMBAR INTERBODY FUSION WITH INSTRUMENTATION AND ALLOGRAFT (Right )  Patient Location: PACU  Anesthesia Type:General  Level of Consciousness: drowsy and patient cooperative  Airway & Oxygen Therapy: Patient Spontanous Breathing and Patient connected to nasal cannula oxygen  Post-op Assessment: Report given to RN and Post -op Vital signs reviewed and stable  Post vital signs: Reviewed and stable  Last Vitals:  Vitals Value Taken Time  BP 115/65 01/02/2018  1:38 PM  Temp    Pulse 81 01/02/2018  1:39 PM  Resp 19 01/02/2018  1:39 PM  SpO2 100 % 01/02/2018  1:39 PM  Vitals shown include unvalidated device data.  Last Pain:  Vitals:   01/02/18 0715  TempSrc: Oral  PainSc:          Complications: No apparent anesthesia complications

## 2018-01-02 NOTE — Anesthesia Procedure Notes (Signed)
Procedure Name: Intubation Date/Time: 01/02/2018 8:40 AM Performed by: Bryson Corona, CRNA Pre-anesthesia Checklist: Patient identified, Emergency Drugs available, Suction available and Patient being monitored Patient Re-evaluated:Patient Re-evaluated prior to induction Oxygen Delivery Method: Circle System Utilized Preoxygenation: Pre-oxygenation with 100% oxygen Induction Type: IV induction and Rapid sequence Laryngoscope Size: Mac and 3 Grade View: Grade I Tube type: Oral Tube size: 7.0 mm Number of attempts: 1 Airway Equipment and Method: Stylet and Bite block Placement Confirmation: ETT inserted through vocal cords under direct vision,  positive ETCO2 and breath sounds checked- equal and bilateral Secured at: 20 cm Tube secured with: Tape Dental Injury: Teeth and Oropharynx as per pre-operative assessment

## 2018-01-02 NOTE — H&P (Signed)
PREOPERATIVE H&P  Chief Complaint: Right leg pain  HPI: Emily Hess is a 72 y.o. 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 Patient is also noted to have a grade 2 spondylolisthesis at this level.   Patient has failed multiple forms of conservative care and continues to have pain (see office notes for additional details regarding the patient's full course of treatment)  Past Medical History:  Diagnosis Date  . Anxiety   . Cataract   . Depression   . Fibromyalgia   . GERD (gastroesophageal reflux disease)   . Heart murmur    MVP  . Hypothyroidism   . Liver cirrhosis secondary to NASH (HCC)   . MVP (mitral valve prolapse)   . Radiculopathy    right -sided lumbar  . Seasonal allergies   . Wears glasses    Past Surgical History:  Procedure Laterality Date  . CARPAL TUNNEL RELEASE Right   . CHOLECYSTECTOMY    . COLONOSCOPY    . LUMBAR LAMINECTOMY/DECOMPRESSION MICRODISCECTOMY N/A 07/07/2015   Procedure: LUMBAR 3-4, LUMBAR 4-5 DECOMPRESSION;  Surgeon: Estill Bamberg, MD;  Location: [facility] OR;  Service: Orthopedics;  Laterality: N/A;  . TONSILLECTOMY     Social History   Socioeconomic History  . Marital status: Married    Spouse name: Not on file  . Number of children: Not on file  . Years of education: Not on file  . Highest education level: Not on file  Occupational History  . Not on file  Social Needs  . Financial resource strain: Not on file  . Food insecurity:    Worry: Not on file    Inability: Not on file  . Transportation needs:    Medical: Not on file    Non-medical: Not on file  Tobacco Use  . Smoking status: Never Smoker  . Smokeless tobacco: Never Used  Substance and Sexual Activity  . Alcohol use: No  . Drug use: No  . Sexual activity: Not on file  Lifestyle  . Physical activity:    Days per week: Not on file    Minutes per  session: Not on file  . Stress: Not on file  Relationships  . Social connections:    Talks on phone: Not on file    Gets together: Not on file    Attends religious service: Not on file    Active member of club or organization: Not on file    Attends meetings of clubs or organizations: Not on file    Relationship status: Not on file  Other Topics Concern  . Not on file  Social History Narrative  . Not on file   Family History  Problem Relation Age of Onset  . Dementia Mother   . Hypertension Mother   . Dementia Father    Allergies  Allergen Reactions  . Sulfa Antibiotics Nausea Only and Nausea And Vomiting  . Corn Oil Cough  . Corn-Containing Products Other (See Comments)    Congested, runny nose  . Tylenol [Acetaminophen] Other (See Comments)    Compensated liver because of gall bladder surgery, does not take as a precaution   Prior to Admission medications   Medication Sig Start Date End Date Taking? Authorizing Provider  calcium carbonate (OSCAL) 1500 (600 Ca) MG TABS tablet Take 600 mg of elemental calcium by mouth daily.   Yes [provider]  carboxymethylcellulose (REFRESH PLUS) 0.5 % SOLN Place 1 drop into both eyes 3 (three) times daily as needed (dry eyes).   Yes [provider]  Cholecalciferol (VITAMIN D3) 5000 units CAPS Take 5,000 Units by mouth daily.   Yes [provider]  diclofenac (VOLTAREN) 75 MG EC tablet Take 75 mg by mouth 2 (two) times daily as needed for pain. 10/15/17  Yes [provider]  dicyclomine (BENTYL) 20 MG tablet Take 20 mg by mouth 3 (three) times daily as needed. Abdominal spasms 11/16/17  Yes [provider]  diphenoxylate-atropine (LOMOTIL) 2.5-0.025 MG tablet Take 1 tablet by mouth 4 (four) times daily as needed for diarrhea or loose stools. 10/16/17  Yes [provider]  fexofenadine (ALLEGRA ALLERGY) 180 MG tablet Take 180 mg by mouth daily.   Yes [provider]  gabapentin  (NEURONTIN) 100 MG capsule Take 100 mg by mouth 3 (three) times daily.   Yes [provider]  HYDROcodone-acetaminophen (NORCO) 10-325 MG tablet Take 1 tablet by mouth every 6 (six) hours as needed for pain. 11/30/17  Yes [provider]  levothyroxine (SYNTHROID, LEVOTHROID) 88 MCG tablet Take 88 mcg by mouth daily before breakfast.    Yes [provider]  montelukast (SINGULAIR) 10 MG tablet Take 10 mg by mouth at bedtime.   Yes [provider]  OVER THE COUNTER MEDICATION Place 1 Dose under the tongue 2 (two) times daily. CBD OIL   Yes [provider]  OVER THE COUNTER MEDICATION Take 5 tablets by mouth 2 (two) times daily. Hypericum perforatum   Yes [provider]  OVER THE COUNTER MEDICATION daily as needed.   Yes [provider]  sertraline (ZOLOFT) 100 MG tablet Take 200 mg by mouth daily.   Yes [provider]  traMADol (ULTRAM) 50 MG tablet Take 50 mg by mouth every 6 (six) hours as needed (for pain.).   Yes [provider]  traZODone (DESYREL) 100 MG tablet Take 150 mg by mouth at bedtime. 10/20/17  Yes [provider]  vitamin B-12 (CYANOCOBALAMIN) 1000 MCG tablet Take 1,000 mcg by mouth daily.   Yes [provider]     All other systems have been reviewed and were otherwise negative with the exception of those mentioned in the HPI and as above.  Physical Exam: Vitals:   01/02/18 0715  BP: (!) 149/73  Pulse: 71  Resp: 18  Temp: (!) 97.4 F (36.3 C)  SpO2: 100%    Body mass index is 25.15 kg/m.  General: Alert, no acute distress Cardiovascular: No pedal edema Respiratory: No cyanosis, no use of accessory musculature Skin: + incision spanning L3-L5 Neurologic: Sensation intact distally Psychiatric: Patient is competent for consent with normal mood and affect Lymphatic: No axillary or cervical lymphadenopathy  MUSCULOSKELETAL: + SLR on the right  Assessment/Plan: RIGHT  SIDED LUMBAR RADICULOPATHY Plan for Procedure(s): RIGHT-SIDED LUMBAR 4-5 TRANSFORAMINAL LUMBAR INTERBODY FUSION WITH INSTRUMENTATION AND ALLOGRAFT   Emilee Hero, MD 01/02/2018 8:04 AM

## 2018-01-03 ENCOUNTER — Encounter (HOSPITAL_COMMUNITY): Payer: Self-pay | Admitting: Orthopedic Surgery

## 2018-01-03 LAB — BLOOD GAS, ARTERIAL
ACID-BASE DEFICIT: 0.3 mmol/L (ref 0.0–2.0)
Bicarbonate: 23.4 mmol/L (ref 20.0–28.0)
Drawn by: 519031
FIO2: 21
O2 SAT: 96.2 %
PCO2 ART: 35.3 mmHg (ref 32.0–48.0)
PO2 ART: 79.7 mmHg — AB (ref 83.0–108.0)
Patient temperature: 98.6
pH, Arterial: 7.437 (ref 7.350–7.450)

## 2018-01-03 MED FILL — Thrombin (Recombinant) For Soln 20000 Unit: CUTANEOUS | Qty: 1 | Status: AC

## 2018-01-03 NOTE — Progress Notes (Signed)
Patient alert and oriented, mae's well, voiding adequate amount of urine, swallowing without difficulty,  c/o mild pain at time of discharge. Patient discharged home with family. Script and discharged instructions given to patient. Patient and family stated understanding of instructions given. Patient has an appointment with Dr. Dumonski 

## 2018-01-03 NOTE — Progress Notes (Signed)
Orthopedic Tech Progress Note Patient Details:  Emily Hess 25-Dec-1945 [id number]  Patient ID: Emily HazyPatricia M Hess, female   DOB: 25-Dec-1945, 72 y.o.   MRN: [id number]   Saul FordyceJennifer C Micholas Drumwright 01/03/2018, 8:46 AMCalled [employer] for TLSO brace.

## 2018-01-03 NOTE — Progress Notes (Signed)
Occupational Therapy Evaluation Patient Details Name: Emily Hess MRN: [id number] DOB: 05-05-1945 Today's Date: 01/03/2018    History of Present Illness Emily Hess is a 72 y.o. female who presents with ongoing pain in the right leg. 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 s/p L4/5 revision decompression and fusion.   Clinical Impression   PTA Pt indepdendent in ADL and mobility. Today Pt is limited by pain, requires max A for LB ADL, min guard for transfers with RW. Pt's husband present and eager for education throughout session. Back handout provided and reviewed adls in detail. Pt educated on: clothing between brace, never sleep in brace, set an alarm at night for medication, avoid sitting for long periods of time, correct bed positioning for sleeping, correct sequence for bed mobility, avoiding lifting more than 5 pounds and never wash directly over incision. Pt also educated in entire AE kit including reacher/grabber, long handle shoe horn, sock donner, and toilet aide. At this time, OT will follow for acute stay as well as recommending HHOT.     Follow Up Recommendations  Home health OT;Supervision/Assistance - 24 hour(initially)    Equipment Recommendations  None recommended by OT(Pt has appropriate DME)    Recommendations for Other Services       Precautions / Restrictions Precautions Precautions: Back Precaution Booklet Issued: Yes (comment) Precaution Comments: reviewed handout in full Required Braces or Orthoses: Spinal Brace Spinal Brace: Thoracolumbosacral orthotic;Applied in sitting position;Applied in standing position Restrictions Weight Bearing Restrictions: No      Mobility Bed Mobility Overal bed mobility: Needs Assistance Bed Mobility: Sit to Sidelying         Sit to sidelying: Mod assist;HOB elevated General bed mobility comments: use of bed rail, assist for BLE back into the bed,  vc for sequencing trhoughout  Transfers Overall transfer level: Needs assistance Equipment used: Rolling walker (2 wheeled) Transfers: Sit to/from Stand Sit to Stand: Min guard         General transfer comment: vc for safe hand placement (pt pulled up on the RW despite education)    Balance Overall balance assessment: Mild deficits observed, not formally tested                                         ADL either performed or assessed with clinical judgement   ADL Overall ADL's : Needs assistance/impaired Eating/Feeding: Independent   Grooming: Min guard;Standing;Wash/dry hands;Wash/dry face Grooming Details (indicate cue type and reason): educated in cup method for oral care, and setting items up on the right to prevent twisting Upper Body Bathing: Minimal assistance;With adaptive equipment;With caregiver independent assisting;Sitting Upper Body Bathing Details (indicate cue type and reason): educated in long handle sponge Lower Body Bathing: Maximal assistance;With caregiver independent assisting;With adaptive equipment;Sit to/from stand Lower Body Bathing Details (indicate cue type and reason): educated in long handle sponge Upper Body Dressing : Moderate assistance;Sitting;With caregiver independent assisting Upper Body Dressing Details (indicate cue type and reason): to don/doff brace Lower Body Dressing: Moderate assistance;With caregiver independent assisting;With adaptive equipment;Sit to/from stand Lower Body Dressing Details (indicate cue type and reason): educated on grabber/reacher, long handle shoe horn, sock donner Toilet Transfer: Min guard;RW;Ambulation(BSC over toilet)   Toileting- Clothing Manipulation and Hygiene: Maximal assistance;Sit to/from stand Toileting - Clothing Manipulation Details (indicate cue type and reason): Pt currently unable to perform  peri care (front or back), educated on toilet aide     Functional mobility during ADLs: Min  guard;Rolling walker General ADL Comments: Pt very limited by pain this session - but willing and able to participate in OT education/practice with DME and AE, and taking notes during session.      Vision         Perception     Praxis      Pertinent Vitals/Pain Pain Assessment: 0-10 Pain Score: 10-Worst pain ever Pain Location: incision site - back Pain Descriptors / Indicators: Constant;Discomfort;Grimacing;Sore Pain Intervention(s): Monitored during session;Repositioned;Ice applied     Hand Dominance Right   Extremity/Trunk Assessment Upper Extremity Assessment Upper Extremity Assessment: Overall WFL for tasks assessed   Lower Extremity Assessment Lower Extremity Assessment: Overall WFL for tasks assessed   Cervical / Trunk Assessment Cervical / Trunk Assessment: Other exceptions Cervical / Trunk Exceptions: s/p lumbar sx   Communication Communication Communication: No difficulties   Cognition Arousal/Alertness: Awake/alert Behavior During Therapy: WFL for tasks assessed/performed Overall Cognitive Status: Within Functional Limits for tasks assessed                                     General Comments  husband present throughout session    Exercises     Shoulder Instructions      Home Living Family/patient expects to be discharged to:: Private residence Living Arrangements: Spouse/significant other Available Help at Discharge: Family;Available 24 hours/day Type of Home: House Home Access: Stairs to enter [employer] of Steps: 1-2 Entrance Stairs-Rails: None Home Layout: Able to live on main level with bedroom/bathroom;Laundry or work area in basement     [employer] Shower/Tub: Corporate investment banker: Handicapped height Bathroom Accessibility: Yes How Accessible: Accessible via [place]: Shower seat;Grab bars - toilet;Grab bars - tub/shower;Hand held shower head;Adaptive equipment Adaptive  Equipment: Reacher Additional Comments: main "living room" is in basement, it is a walk-out basement, educated that if they can drive around that might be an option if she cannot do stairs yet      Prior Functioning/Environment Level of Independence: Independent                 OT Problem List: Decreased range of motion;Decreased activity tolerance;Impaired balance (sitting and/or standing);Decreased knowledge of use of DME or AE;Decreased knowledge of precautions;Pain      OT Treatment/Interventions: Self-care/ADL training;DME and/or AE instruction;Therapeutic activities;Patient/family education    OT Goals(Current goals can be found in the care plan section) Acute Rehab OT Goals Patient Stated Goal: to get moving better OT Goal Formulation: With patient/family Time For Goal Achievement: 01/17/18 Potential to Achieve Goals: Good ADL Goals Pt Will Perform Grooming: with supervision;standing Pt Will Perform Lower Body Bathing: with set-up;with adaptive equipment;sit to/from stand Pt Will Perform Lower Body Dressing: with supervision;with adaptive equipment;sit to/from stand Pt Will Transfer to Toilet: with modified independence;ambulating Pt Will Perform Toileting - Clothing Manipulation and hygiene: with supervision;with caregiver independent in assisting;with adaptive equipment;sit to/from stand  OT Frequency: Min 2X/week   Barriers to D/C:            Co-evaluation              AM-PAC PT "6 Clicks" Daily Activity     Outcome Measure Help from another person eating meals?: None Help from another person taking care of personal grooming?: A Little Help from another person toileting, which  includes using toliet, bedpan, or urinal?: A Lot Help from another person bathing (including washing, rinsing, drying)?: A Lot Help from another person to put on and taking off regular upper body clothing?: A Lot Help from another person to put on and taking off regular lower body  clothing?: A Lot 6 Click Score: 15   End of Session Equipment Utilized During Treatment: Rolling walker;Back brace Nurse Communication: Mobility status  Activity Tolerance: Patient limited by pain Patient left: in bed;with call bell/phone within reach;with family/visitor present  OT Visit Diagnosis: Unsteadiness on feet (R26.81);Other abnormalities of gait and mobility (R26.89);Pain Pain - part of body: (back)                Time: 2778-2423 OT Time Calculation (min): 35 min Charges:  OT General Charges $OT Visit: 1 Visit OT Evaluation $OT Eval Moderate Complexity: 1 Mod OT Treatments $Self Care/Home Management : 8-22 mins Hulda Humphrey OTR/L Acute Rehabilitation Services Pager: [phone] Office: [place] 01/03/2018, 10:34 AM

## 2018-01-03 NOTE — Anesthesia Postprocedure Evaluation (Signed)
Anesthesia Post Note  Patient: [name]  Procedure(s) Performed: RIGHT-SIDED LUMBAR 4-5 TRANSFORAMINAL LUMBAR INTERBODY FUSION WITH INSTRUMENTATION AND ALLOGRAFT (Right )     Patient location during evaluation: PACU Anesthesia Type: General Level of consciousness: awake and alert Pain management: pain level controlled Vital Signs Assessment: post-procedure vital signs reviewed and stable Respiratory status: spontaneous breathing, nonlabored ventilation, respiratory function stable and patient connected to nasal cannula oxygen Cardiovascular status: blood pressure returned to baseline and stable Postop Assessment: no apparent nausea or vomiting Anesthetic complications: no Comments: Complaint of Chest discomfort, EKG normal, chest discomfort improving.    Last Vitals:  Vitals:   01/03/18 0319 01/03/18 0730  BP: 102/61 108/62  Pulse: 100 (!) 103  Resp: 18 16  Temp: (!) 38.1 C 37.9 C  SpO2: 99% 93%    Last Pain:  Vitals:   01/03/18 0730  TempSrc: Oral  PainSc:                  Shelton SilvasKevin D Yanni Ruberg

## 2018-01-03 NOTE — Care Management Note (Signed)
Case Management Note  Patient Details  Name: Emily Hess MRN: [id number] Date of Birth: 1945/06/08  Subjective/Objective:                    Action/Plan:   Expected Discharge Date:  01/03/18               Expected Discharge Plan:  Home w Home Health Services  In-House Referral:     Discharge planning Services  CM Consult  Post Acute Care Choice:  Home Health Choice offered to:  Patient  DME Arranged:  N/A DME Agency:  NA  HH Arranged:  PT, OT [facility]  Status of Service:  Completed, signed off  If discussed at Long Length of Stay Meetings, dates discussed:    Additional Comments:  Kingsley PlanWile, Maxen Rowland Marie, RN 01/03/2018, 10:55 AM

## 2018-01-03 NOTE — Progress Notes (Signed)
    Patient doing well  + expected LBP Patient reports resolution of preop left and right leg pain   Physical Exam: Vitals:   01/02/18 2316 01/03/18 0319  BP: (!) 119/41 102/61  Pulse: 78 100  Resp: 18 18  Temp: 98.3 F (36.8 C) (!) 100.6 F (38.1 C)  SpO2: 99% 99%   Patient appears comfortable Dressing in place NVI  POD #1 s/p L4/5 revision decompression and fusion  - up with PT/OT, encourage ambulation - Oxycodone for pain, Valium for muscle spasms - d/c home today with f/u in 2 weeks

## 2018-01-03 NOTE — Progress Notes (Signed)
Physical Therapy Evaluation Patient Details Name: Emily Hess MRN: [id number] DOB: [birth year] Today's Date: 01/03/2018   History of Present Illness  Emily Hess is a 72 y.o. female who presents with ongoing pain in the right leg. 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 s/p L4/5 revision decompression and fusion.  Clinical Impression  Patient is s/p above surgery resulting in deficits listed below (see PT Problem List). At time of evaluation pt performed ambulation with gross min A- min G with RW for safety. Believe pt is limited in post-op mobility secondary to increased pain levels. Expect pt to mobilize better with improved pain control. Pt educated on generalized walking program, positioning, and precautions. Recommending RW at d/c to improve balance and safety with mobility.  Will continue to follow while admitted and progress as able to increase independence with mobility.      Follow Up Recommendations Home health PT;Supervision/Assistance - 24 hour    Equipment Recommendations  Rolling walker with 5" wheels    Recommendations for Other Services       Precautions / Restrictions Precautions Precautions: Back Precaution Booklet Issued: Yes (comment) Precaution Comments: Reviewed precautions in full with pt and spouse Required Braces or Orthoses: Spinal Brace Spinal Brace: Thoracolumbosacral orthotic;Applied in sitting position;Applied in standing position Restrictions Weight Bearing Restrictions: No      Mobility  Bed Mobility Overal bed mobility: Needs Assistance Bed Mobility: Rolling;Sidelying to Sit Rolling: Min assist Sidelying to sit: Min assist;HOB elevated       General bed mobility comments: Min A required for log roll with multimodal cues for sequencing and hand position. HOB elevated to simulate home environment. Pt reporting increased pain with transitions to sitting. Instructed pt's  husband on proper placement of hands to assist pt if needed at home as spouse attempted to assist by pulling on pt's R arm.   Transfers Overall transfer level: Needs assistance Equipment used: Rolling walker (2 wheeled) Transfers: Sit to/from Stand Sit to Stand: Min guard         General transfer comment: Pt initially requested to stand without use of RW, with LOB posteriorly requiring mod A +1 at bedside. Transitioned to RW for second attempt to stand with vc for safe hand placement  Ambulation/Gait Ambulation/Gait assistance: Min guard;Min assist Gait Distance (Feet): 125 Feet Assistive device: Rolling walker (2 wheeled) Gait Pattern/deviations: Step-to pattern;Step-through pattern;Decreased stance time - right;Trunk flexed;Leaning posteriorly Gait velocity: decreased Gait velocity interpretation: <1.31 ft/sec, indicative of household ambulator General Gait Details: Pt slow and guarded throughout, requiring cues for upright posture and proximity to RW. Pt closed eyes intermittently throughout ambulation secondary to pain with pt demonstrating a posterior lean requiring min A to prevent LOB posteriorly. Pt demonstrated frustration with spouse attempting to cue pt with gait.    Stairs Stairs: Yes Stairs assistance: Min assist;+2 safety/equipment Stair Management: No rails;Step to pattern;Forwards;With cane Number of Stairs: 2 General stair comments: Pt demonstrating anxiety initially with verbal stair instructions. Pt reports she uses house to stabilize when ascending/descending stairs at home. To increase pt safety instructed pt in negotiating forwards with stronger LE leading with use of cane on the left and HHA+1 on the right. Pt descended stairs with weaker LE leading and cane on the right and pt utilizing SPT shoulder. Verbally discussed guarding techniques and HHA with spouse. Cues required throughout for safety and sequencing.   Wheelchair Mobility    Modified Rankin (Stroke  Patients Only)  Balance Overall balance assessment: Needs assistance Sitting-balance support: Single extremity supported;Feet supported Sitting balance-Leahy Scale: Fair     Standing balance support: Bilateral upper extremity supported;During functional activity Standing balance-Leahy Scale: Poor Standing balance comment: Reliant on Bil UE support                             Pertinent Vitals/Pain Pain Assessment: 0-10 Pain Score: 9  Pain Location: incision site - back Pain Descriptors / Indicators: Constant;Aching;Operative site guarding;Guarding Pain Intervention(s): Limited activity within patient's tolerance;Monitored during session;Repositioned    Home Living Family/patient expects to be discharged to:: Private residence Living Arrangements: Spouse/significant other Available Help at Discharge: Family;Available 24 hours/day Type of Home: House Home Access: Stairs to enter Entrance Stairs-Rails: None Entrance Stairs-Number of Steps: 2 Home Layout: Able to live on main level with bedroom/bathroom;Laundry or work area in [employer] Equipment: Shower seat;Grab bars - toilet;Grab bars - tub/shower;Hand held shower head;Adaptive equipment;Cane - single point Additional Comments: main "living room" is in basement, it is a walk-out basement, educated that if they can drive around that might be an option if she cannot do stairs yet    Prior Function Level of Independence: Independent               Hand Dominance   Dominant Hand: Right    Extremity/Trunk Assessment   Upper Extremity Assessment Upper Extremity Assessment: Defer to OT evaluation    Lower Extremity Assessment Lower Extremity Assessment: Generalized weakness(Pt reports RLE sensation=LLE with LE dermatome testing)    Cervical / Trunk Assessment Cervical / Trunk Assessment: Other exceptions Cervical / Trunk Exceptions: s/p lumbar sx  Communication   Communication: No difficulties   Cognition Arousal/Alertness: Awake/alert Behavior During Therapy: Anxious Overall Cognitive Status: Impaired/Different from baseline Area of Impairment: Safety/judgement                         Safety/Judgement: Decreased awareness of safety     General Comments: Pt initially refusing to use RW for safety and hesitant to use SPC reporting sensation was improved since surgery and was only using SPC prior due to RLE numbness.       General Comments General comments (skin integrity, edema, etc.): Spouse present and engaged throughout session.    Exercises     Assessment/Plan    PT Assessment Patient needs continued PT services  PT Problem List Decreased range of motion;Decreased strength;Decreased activity tolerance;Decreased balance;Decreased mobility;Decreased coordination;Decreased knowledge of use of DME;Decreased safety awareness;Decreased knowledge of precautions;Pain       PT Treatment Interventions DME instruction;Gait training;Stair training;Functional mobility training;Therapeutic activities;Balance training;Patient/family education;Modalities    PT Goals (Current goals can be found in the Care Plan section)  Acute Rehab PT Goals Patient Stated Goal: to get moving better PT Goal Formulation: With patient Time For Goal Achievement: 01/10/18 Potential to Achieve Goals: Good    Frequency Min 5X/week   Barriers to discharge        Co-evaluation               AM-PAC PT "6 Clicks" Daily Activity  Outcome Measure Difficulty turning over in bed (including adjusting bedclothes, sheets and blankets)?: Unable Difficulty moving from lying on back to sitting on the side of the bed? : Unable Difficulty sitting down on and standing up from a chair with arms (e.g., wheelchair, bedside commode, etc,.)?: Unable Help needed moving to and from a bed to chair (  including a wheelchair)?: A Little Help needed walking in hospital room?: A Little Help needed climbing  3-5 steps with a railing? : A Lot 6 Click Score: 11    End of Session Equipment Utilized During Treatment: Gait belt;Back brace Activity Tolerance: Patient limited by pain Patient left: in chair;with call bell/phone within reach;with family/visitor present Nurse Communication: Mobility status PT Visit Diagnosis: Other abnormalities of gait and mobility (R26.89);Unsteadiness on feet (R26.81);Muscle weakness (generalized) (M62.81);Difficulty in walking, not elsewhere classified (R26.2);Pain Pain - part of body: (incision at back)    Time: 9629-5284 PT Time Calculation (min) (ACUTE ONLY): 48 min   Charges:   PT Evaluation $PT Eval Moderate Complexity: 1 Mod PT Treatments $Gait Training: 23-37 mins        Donzetta Kohut, [place]  Student Physical Therapist Acute Rehab [phone]   Donzetta Kohut 01/03/2018, 2:53 PM

## 2018-01-04 MED FILL — Heparin Sodium (Porcine) Inj 1000 Unit/ML: INTRAMUSCULAR | Qty: 30 | Status: AC

## 2018-01-04 MED FILL — Sodium Chloride IV Soln 0.9%: INTRAVENOUS | Qty: 1000 | Status: AC

## 2018-01-23 NOTE — Discharge Summary (Signed)
Patient ID: Emily Hess MRN: [id number] DOB/AGE: Apr 27, 1945 72 y.o.  Admit date: 01/02/2018 Discharge date: 01/03/2018  Admission Diagnoses:  Active Problems:   Radiculopathy   Discharge Diagnoses:  Same  Past Medical History:  Diagnosis Date  . Anxiety   . Cataract   . Depression   . Fibromyalgia   . GERD (gastroesophageal reflux disease)   . Heart murmur    MVP  . Hypothyroidism   . Liver cirrhosis secondary to NASH (HCC)   . MVP (mitral valve prolapse)   . Radiculopathy    right -sided lumbar  . Seasonal allergies   . Wears glasses     Surgeries: Procedure(s): RIGHT-SIDED LUMBAR 4-5 TRANSFORAMINAL LUMBAR INTERBODY FUSION WITH INSTRUMENTATION AND ALLOGRAFT on 01/02/2018   Consultants: None  Discharged Condition: Improved  Hospital Course: Emily Hess is an 72 y.o. female who was admitted 01/02/2018 for operative treatment of radiculopathy. Patient has severe unremitting pain that affects sleep, daily activities, and work/hobbies. After pre-op clearance the patient was taken to the operating room on 01/02/2018 and underwent  Procedure(s): RIGHT-SIDED LUMBAR 4-5 TRANSFORAMINAL LUMBAR INTERBODY FUSION WITH INSTRUMENTATION AND ALLOGRAFT.    Patient was given perioperative antibiotics:  Anti-infectives (From admission, onward)   Start     Dose/Rate Route Frequency Ordered Stop   01/02/18 2200  vancomycin (VANCOCIN) IVPB 1000 mg/200 mL premix     1,000 mg 200 mL/hr over 60 Minutes Intravenous  Once 01/02/18 1740 01/02/18 2215   01/02/18 0730  vancomycin (VANCOCIN) IVPB 1000 mg/200 mL premix     1,000 mg 200 mL/hr over 60 Minutes Intravenous On call to O.R. 01/02/18 0725 01/02/18 0827   01/02/18 4098  vancomycin (VANCOCIN) 1-5 GM/200ML-% IVPB    Note to Pharmacy:  [facility]   : cabinet override      01/02/18 0658 01/02/18 1914       Patient was given sequential compression devices, early ambulation to prevent DVT.  Patient benefited maximally  from hospital stay and there were no complications.    Recent vital signs: BP 108/62 (BP Location: Right Arm)   Pulse (!) 103   Temp 100.2 F (37.9 C) (Oral) Comment: nurse notified  Resp 16   Ht 5\' 3"  (1.6 m)   Wt 64.4 kg   SpO2 93%   BMI 25.15 kg/m    Discharge Medications:   Allergies as of 01/03/2018      Reactions   Sulfa Antibiotics Nausea Only, Nausea And Vomiting   Corn Oil Cough   Corn-containing Products Other (See Comments)   Congested, runny nose   Tylenol [acetaminophen] Other (See Comments)   Compensated liver because of gall bladder surgery, does not take as a precaution      Medication List    STOP taking these medications   HYDROcodone-acetaminophen 10-325 MG tablet Commonly known as:  NORCO     TAKE these medications   ALLEGRA ALLERGY 180 MG tablet Generic drug:  fexofenadine Take 180 mg by mouth daily.   calcium carbonate 1500 (600 Ca) MG Tabs tablet Commonly known as:  OSCAL Take 600 mg of elemental calcium by mouth daily.   carboxymethylcellulose 0.5 % Soln Commonly known as:  REFRESH PLUS Place 1 drop into both eyes 3 (three) times daily as needed (dry eyes).   dicyclomine 20 MG tablet Commonly known as:  BENTYL Take 20 mg by mouth 3 (three) times daily as needed. Abdominal spasms   diphenoxylate-atropine 2.5-0.025 MG tablet Commonly known as:  LOMOTIL Take  1 tablet by mouth 4 (four) times daily as needed for diarrhea or loose stools.   gabapentin 100 MG capsule Commonly known as:  NEURONTIN Take 100 mg by mouth 3 (three) times daily.   levothyroxine 88 MCG tablet Commonly known as:  SYNTHROID, LEVOTHROID Take 88 mcg by mouth daily before breakfast.   montelukast 10 MG tablet Commonly known as:  SINGULAIR Take 10 mg by mouth at bedtime.   OVER THE COUNTER MEDICATION Place 1 Dose under the tongue 2 (two) times daily. CBD OIL   OVER THE COUNTER MEDICATION Take 5 tablets by mouth 2 (two) times daily. Hypericum perforatum   OVER  THE COUNTER MEDICATION daily as needed.   sertraline 100 MG tablet Commonly known as:  ZOLOFT Take 200 mg by mouth daily.   traMADol 50 MG tablet Commonly known as:  ULTRAM Take 50 mg by mouth every 6 (six) hours as needed (for pain.).   traZODone 100 MG tablet Commonly known as:  DESYREL Take 150 mg by mouth at bedtime.   vitamin B-12 1000 MCG tablet Commonly known as:  CYANOCOBALAMIN Take 1,000 mcg by mouth daily.   Vitamin D3 5000 units Caps Take 5,000 Units by mouth daily.       Diagnostic Studies: Dg Lumbar Spine 2-3 Views  Result Date: 01/02/2018 CLINICAL DATA:  L4-5 fusion EXAM: DG C-ARM 61-120 MIN; LUMBAR SPINE - 2-3 VIEW COMPARISON:  Intraoperative film from earlier in the same day. FLUOROSCOPY TIME:  Radiation Exposure Index (as provided by the fluoroscopic device): Not available If the device does not provide the exposure index: Fluoroscopy Time:  1 minutes 21 seconds Number of Acquired Images:  2 FINDINGS: Interbody fusion at L4-5 is noted with pedicle screws and posterior fixation. IMPRESSION: L4-5 fusion Electronically Signed   By: Alcide Clever M.D.   On: 01/02/2018 14:34   Dg Lumbar Spine 2-3 Views  Result Date: 01/02/2018 CLINICAL DATA:  L4-5 transforaminal interbody fusion. EXAM: LUMBAR SPINE - 2-3 VIEW COMPARISON:  07/07/2015 FINDINGS: Single cross-table lateral portable image of the lumbar spine obtained at 0901 hours is labeled "#1". 2 spinal needles are identified in the lower back. Using the caudal most lumbar type vertebral body as the L5 level, the cranial most medial has its tip overlying a position just posterior to the L3-4 facets. The more caudal of the 2 needles has the tip overlying a position just posterior to the L4-5 facets. IMPRESSION: Intraoperative localization. Electronically Signed   By: Kennith Center M.D.   On: 01/02/2018 11:21   Dg C-arm 1-60 Min  Result Date: 01/02/2018 CLINICAL DATA:  L4-5 fusion EXAM: DG C-ARM 61-120 MIN; LUMBAR SPINE -  2-3 VIEW COMPARISON:  Intraoperative film from earlier in the same day. FLUOROSCOPY TIME:  Radiation Exposure Index (as provided by the fluoroscopic device): Not available If the device does not provide the exposure index: Fluoroscopy Time:  1 minutes 21 seconds Number of Acquired Images:  2 FINDINGS: Interbody fusion at L4-5 is noted with pedicle screws and posterior fixation. IMPRESSION: L4-5 fusion Electronically Signed   By: Alcide Clever M.D.   On: 01/02/2018 14:34   Dg C-arm 1-60 Min  Result Date: 01/02/2018 CLINICAL DATA:  L4-5 fusion EXAM: DG C-ARM 61-120 MIN; LUMBAR SPINE - 2-3 VIEW COMPARISON:  Intraoperative film from earlier in the same day. FLUOROSCOPY TIME:  Radiation Exposure Index (as provided by the fluoroscopic device): Not available If the device does not provide the exposure index: Fluoroscopy Time:  1 minutes 21 seconds  Number of Acquired Images:  2 FINDINGS: Interbody fusion at L4-5 is noted with pedicle screws and posterior fixation. IMPRESSION: L4-5 fusion Electronically Signed   By: Alcide Clever M.D.   On: 01/02/2018 14:34   Dg C-arm 1-60 Min  Result Date: 01/02/2018 CLINICAL DATA:  L4-5 fusion EXAM: DG C-ARM 61-120 MIN; LUMBAR SPINE - 2-3 VIEW COMPARISON:  Intraoperative film from earlier in the same day. FLUOROSCOPY TIME:  Radiation Exposure Index (as provided by the fluoroscopic device): Not available If the device does not provide the exposure index: Fluoroscopy Time:  1 minutes 21 seconds Number of Acquired Images:  2 FINDINGS: Interbody fusion at L4-5 is noted with pedicle screws and posterior fixation. IMPRESSION: L4-5 fusion Electronically Signed   By: Alcide Clever M.D.   On: 01/02/2018 14:34    Disposition:    POD #1 s/p L4/5 revision decompression and fusion  - up with PT/OT, encourage ambulation - Oxycodone for pain, Valium for muscle spasms -Written scripts for pain signed and in chart -D/C instructions sheet printed and in chart -D/C today  -F/U in office 2  weeks   Signed: Georga Bora 01/23/2018, 1:59 PM

## 2018-10-23 DEATH — deceased

## 2020-05-01 IMAGING — CR DG LUMBAR SPINE 2-3V
1 series · 1 of 1 positions shown · non-contrast
Comparison: 07/07/2015

CLINICAL DATA: L4-5 transforaminal interbody fusion.

EXAM:
LUMBAR SPINE - 2-3 VIEW

[xtable lateral]
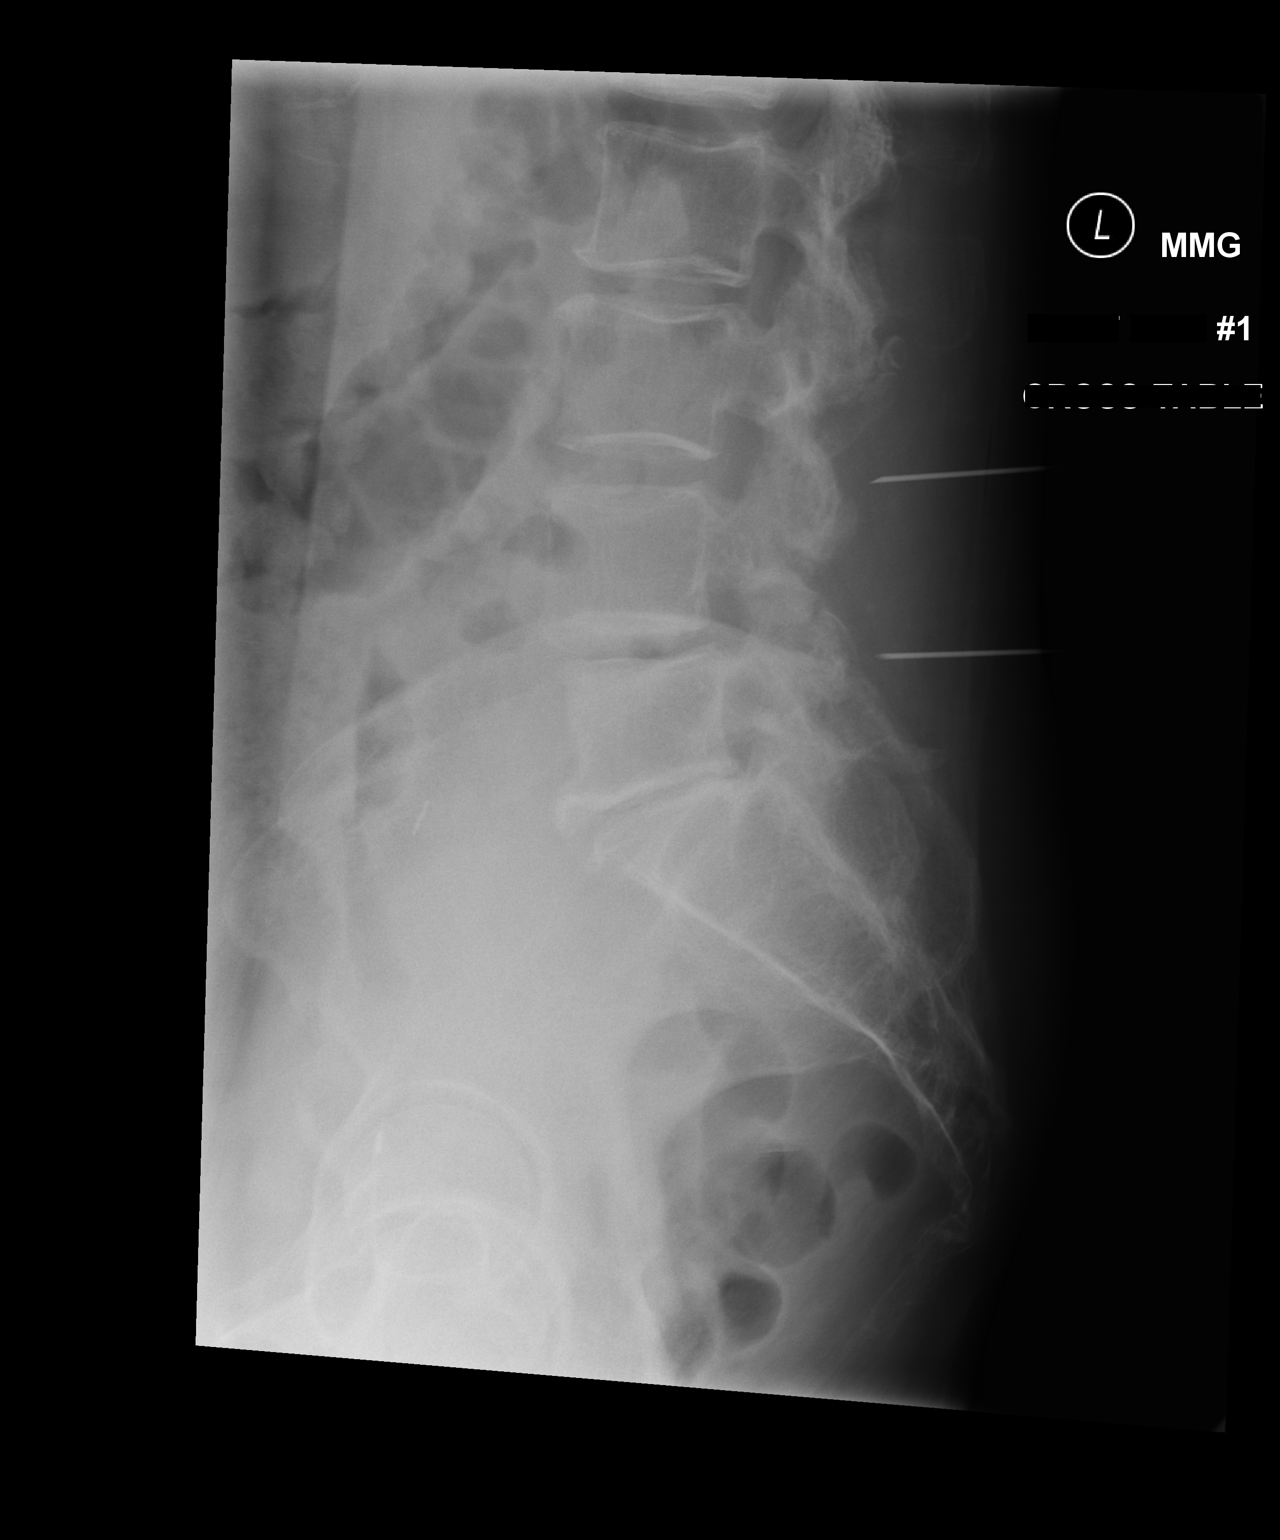

[1 of 1 positions shown; findings below may reference images not displayed]

FINDINGS: Single cross-table lateral portable image of the lumbar spine
obtained at 7277 hours is labeled "#1". 2 spinal needles are
identified in the lower back. Using the caudal most lumbar type
vertebral body as the L5 level, the cranial most medial has its tip
overlying a position just posterior to the L3-4 facets. The more
caudal of the 2 needles has the tip overlying a position just
posterior to the L4-5 facets.
IMPRESSION: Intraoperative localization.

## 2020-05-01 IMAGING — RF DG LUMBAR SPINE 2-3V
1 series · 2 of 2 positions shown · non-contrast
Comparison: Intraoperative film from earlier in the same day.

CLINICAL DATA: L4-5 fusion

EXAM:
DG C-ARM 61-120 MIN; LUMBAR SPINE - 2-3 VIEW

[Series 1: run · 2 of 2 slices shown]
[im 1/2]
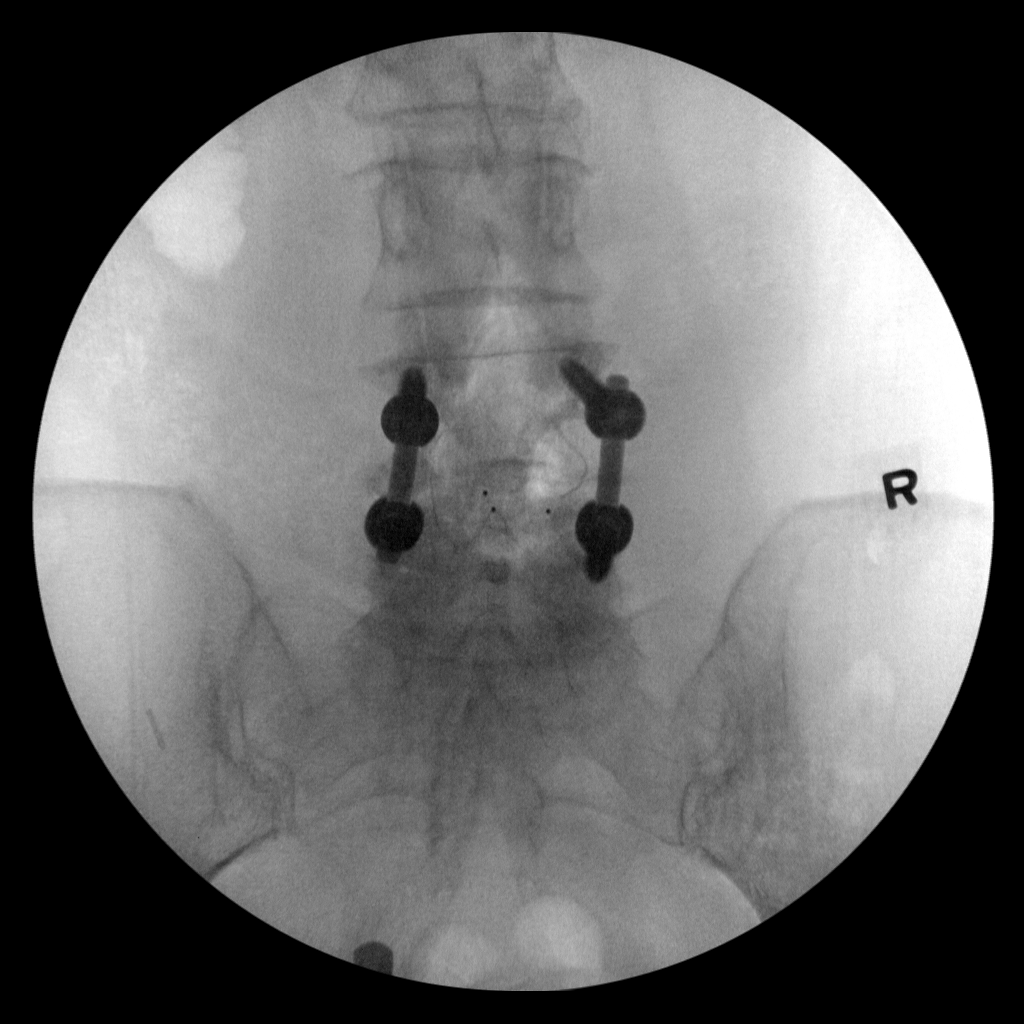
[im 2/2]
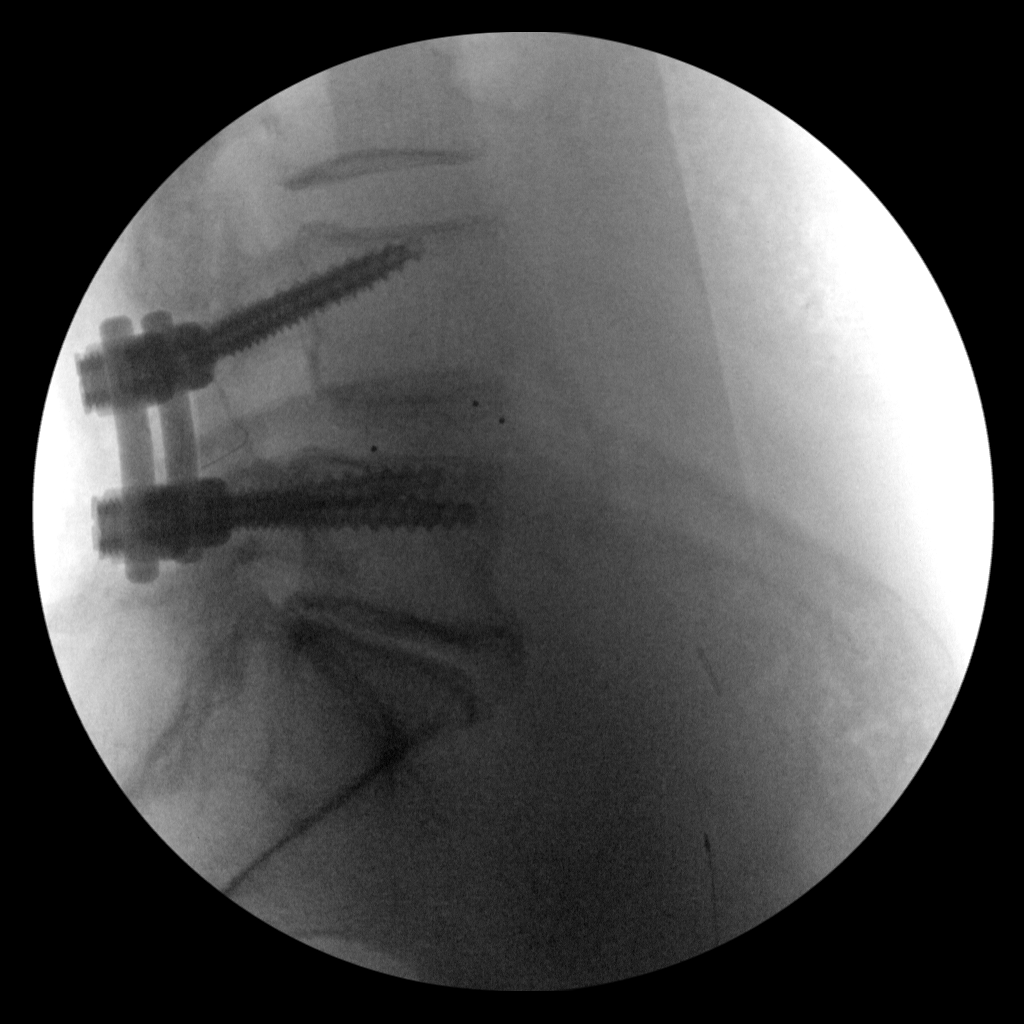

[2 of 2 positions shown; findings below may reference images not displayed]

FLUOROSCOPY TIME:  Radiation Exposure Index (as provided by the
fluoroscopic device): Not available

If the device does not provide the exposure index:

Fluoroscopy Time:  1 minutes 21 seconds

Number of Acquired Images:  2
FINDINGS: Interbody fusion at L4-5 is noted with pedicle screws and posterior
fixation.
IMPRESSION: L4-5 fusion
